# Patient Record
Sex: Female | Born: 1970
Health system: Southern US, Community
[De-identification: ages and names within clinical notes are randomized; demographics above are authoritative.]

## PROBLEM LIST (undated history)

## (undated) DIAGNOSIS — E0500 Thyrotoxicosis with diffuse goiter without thyrotoxic crisis or storm: Secondary | ICD-10-CM

## (undated) DIAGNOSIS — I3139 Other pericardial effusion (noninflammatory): Secondary | ICD-10-CM

## (undated) DIAGNOSIS — I313 Pericardial effusion (noninflammatory): Secondary | ICD-10-CM

## (undated) DIAGNOSIS — E079 Disorder of thyroid, unspecified: Secondary | ICD-10-CM

## (undated) DIAGNOSIS — Z720 Tobacco use: Secondary | ICD-10-CM

## (undated) DIAGNOSIS — J45909 Unspecified asthma, uncomplicated: Secondary | ICD-10-CM

## (undated) HISTORY — DX: Unspecified asthma, uncomplicated: J45.909

## (undated) HISTORY — DX: Tobacco use: Z72.0

## (undated) HISTORY — PX: TONSILLECTOMY: SUR1361

## (undated) HISTORY — DX: Pericardial effusion (noninflammatory): I31.3

## (undated) HISTORY — DX: Other pericardial effusion (noninflammatory): I31.39

---

## 1997-01-02 HISTORY — PX: BREAST LUMPECTOMY: SHX2

## 2003-11-09 ENCOUNTER — Ambulatory Visit: Payer: Self-pay | Admitting: Family Medicine

## 2003-11-09 ENCOUNTER — Other Ambulatory Visit: Admission: RE | Admit: 2003-11-09 | Discharge: 2003-11-09 | Payer: Self-pay | Admitting: Family Medicine

## 2004-01-08 ENCOUNTER — Ambulatory Visit: Payer: Self-pay | Admitting: Family Medicine

## 2005-11-13 ENCOUNTER — Ambulatory Visit: Payer: Self-pay | Admitting: Family Medicine

## 2005-11-20 ENCOUNTER — Encounter: Admission: RE | Admit: 2005-11-20 | Discharge: 2005-11-20 | Payer: Self-pay | Admitting: Family Medicine

## 2006-11-08 DIAGNOSIS — J45909 Unspecified asthma, uncomplicated: Secondary | ICD-10-CM

## 2006-11-14 ENCOUNTER — Ambulatory Visit: Payer: Self-pay | Admitting: Family Medicine

## 2007-08-06 ENCOUNTER — Ambulatory Visit: Payer: Self-pay | Admitting: Family Medicine

## 2007-08-06 DIAGNOSIS — F17200 Nicotine dependence, unspecified, uncomplicated: Secondary | ICD-10-CM

## 2007-09-12 ENCOUNTER — Ambulatory Visit: Payer: Self-pay | Admitting: Family Medicine

## 2007-09-12 LAB — CONVERTED CEMR LAB
ALT: 15 units/L (ref 0–35)
AST: 18 units/L (ref 0–37)
Alkaline Phosphatase: 42 units/L (ref 39–117)
BUN: 7 mg/dL (ref 6–23)
Basophils Absolute: 0 10*3/uL (ref 0.0–0.1)
Basophils Relative: 0.8 % (ref 0.0–3.0)
Blood in Urine, dipstick: NEGATIVE
CO2: 30 meq/L (ref 19–32)
Chloride: 106 meq/L (ref 96–112)
Creatinine, Ser: 0.7 mg/dL (ref 0.4–1.2)
Eosinophils Absolute: 0.2 10*3/uL (ref 0.0–0.7)
Eosinophils Relative: 3.5 % (ref 0.0–5.0)
GFR calc non Af Amer: 99 mL/min
Glucose, Urine, Semiquant: NEGATIVE
HDL: 41.6 mg/dL (ref >39.0)
LDL Cholesterol: 136 mg/dL — ABNORMAL HIGH (ref 0–99)
Lymphocytes Relative: 36.4 % (ref 12.0–46.0)
MCV: 91.7 fL (ref 78.0–100.0)
Neutrophils Relative %: 54.5 % (ref 43.0–77.0)
Nitrite: NEGATIVE
Platelets: 367 10*3/uL (ref 150–400)
Potassium: 4.4 meq/L (ref 3.5–5.1)
Protein, U semiquant: NEGATIVE
RBC: 4.41 M/uL (ref 3.87–5.11)
Total Bilirubin: 0.7 mg/dL (ref 0.3–1.2)
Urobilinogen, UA: 0.2
VLDL: 14 mg/dL (ref 0–40)
WBC Urine, dipstick: NEGATIVE
WBC: 6.2 10*3/uL (ref 4.5–10.5)

## 2007-09-19 ENCOUNTER — Ambulatory Visit: Payer: Self-pay | Admitting: Family Medicine

## 2009-09-08 ENCOUNTER — Ambulatory Visit: Payer: Self-pay | Admitting: Family Medicine

## 2009-09-09 ENCOUNTER — Ambulatory Visit: Payer: Self-pay | Admitting: Family Medicine

## 2010-02-01 NOTE — Assessment & Plan Note (Signed)
Summary: cpx - rv   Vital Signs:  Patient profile:   40 year old female Height:      62 inches Weight:      134 pounds BMI:     24.60 Temp:     98.1 degrees F oral BP sitting:   118 / 78  (left arm) Cuff size:   regular  Vitals Entered By: Kern Reap CMA Duncan Dull) (September 09, 2009 4:54 PM) CC: cpx Is Patient Diabetic? No   CC:  cpx.  History of Present Illness: Kristen Stuart is a 40 year old, divorced female, G1, P1, who comes in today for general physical examination and to request a smoking cessation program.  She's always been in excellent, health.  She's had no chronic health problems.  She currently exercises strenuously 3 times a week and does yoga.  The other two days.  She would like to quit smoking.  She gets routine eye care, dental care, does breast exams, almost daily because she has fibrocystic changes, and had a biopsy of her left breast when she was 20.  She gets annual mammography.  She's currently not sexually active.  LMP now.  She defers back to GYN nurse.  Tetanus 06/12/07 seasonal flu shot June 11, 2009  Her mother died recently and mother had a history of drug addiction and diabetes1.  Her father also died in his 86s from pancreatic cancer  Allergies (verified): 1)  ! Asa 2)  ! Keflex 3)  ! Prednisone  Past History:  Past medical, surgical, family and social histories (including risk factors) reviewed, and no changes noted (except as noted below).  Past Medical History: Reviewed history from 11/14/2006 and no changes required. childbirth x 2 t/a asthma tobacco abuse  Past Surgical History: Lumpectomy..L 12 oclock   Family History: Reviewed history from 09/19/2007 and no changes required. father died of pancreatic cancer at 36, and mother has diabetes, hypertension, high cholesterol , and depression one brother in good healthno sisters  Social History: Reviewed history from 11/14/2006 and no changes required. Current Smoker Drug use-no Regular  exercise-yes Divorced Alcohol use-no  Review of Systems      See HPI  Physical Exam  General:  Well-developed,well-nourished,in no acute distress; alert,appropriate and cooperative throughout examination Head:  Normocephalic and atraumatic without obvious abnormalities. No apparent alopecia or balding. Eyes:  No corneal or conjunctival inflammation noted. EOMI. Perrla. Funduscopic exam benign, without hemorrhages, exudates or papilledema. Vision grossly normal. Ears:  External ear exam shows no significant lesions or deformities.  Otoscopic examination reveals clear canals, tympanic membranes are intact bilaterally without bulging, retraction, inflammation or discharge. Hearing is grossly normal bilaterally. Nose:  External nasal examination shows no deformity or inflammation. Nasal mucosa are pink and moist without lesions or exudates. Mouth:  Oral mucosa and oropharynx without lesions or exudates.  Teeth in good repair. Neck:  No deformities, masses, or tenderness noted. Chest Wall:  No deformities, masses, or tenderness noted. Breasts:  scar in the left breast at the 12 o'clock position from previous biopsy.  Diffuse fibrocystic changes throughout both breasts.  The most dominant lesion is a egg sized  lesion in her left breast at 6 o'clock.  She states this is been present for many years and is not changed. Lungs:  Normal respiratory effort, chest expands symmetrically. Lungs are clear to auscultation, no crackles or wheezes. Heart:  Normal rate and regular rhythm. S1 and S2 normal without gallop, murmur, click, rub or other extra sounds. Abdomen:  Bowel sounds positive,abdomen soft  and non-tender without masses, organomegaly or hernias noted. Msk:  No deformity or scoliosis noted of thoracic or lumbar spine.   Pulses:  R and L carotid,radial,femoral,dorsalis pedis and posterior tibial pulses are full and equal bilaterally Extremities:  No clubbing, cyanosis, edema, or deformity noted with  normal full range of motion of all joints.   Neurologic:  No cranial nerve deficits noted. Station and gait are normal. Plantar reflexes are down-going bilaterally. DTRs are symmetrical throughout. Sensory, motor and coordinative functions appear intact. Skin:  Intact without suspicious lesions or rashes Cervical Nodes:  No lymphadenopathy noted Axillary Nodes:  No palpable lymphadenopathy Inguinal Nodes:  No significant adenopathy Psych:  Cognition and judgment appear intact. Alert and cooperative with normal attention span and concentration. No apparent delusions, illusions, hallucinations   Impression & Recommendations:  Problem # 1:  TOBACCO ABUSE (ICD-305.1) Assessment Unchanged  Her updated medication list for this problem includes:    Chantix Starting Month Pak 0.5 Mg X 11 & 1 Mg X 42 Misc (Varenicline tartrate) ..... Uad    Chantix Continuing Month Pak 1 Mg Tabs (Varenicline tartrate) ..... Uad  Problem # 2:  Preventive Health Care (ICD-V70.0) Assessment: Unchanged  Complete Medication List: 1)  Chantix Starting Month Pak 0.5 Mg X 11 & 1 Mg X 42 Misc (Varenicline tartrate) .... Uad 2)  Chantix Continuing Month Pak 1 Mg Tabs (Varenicline tartrate) .... Uad  Patient Instructions: 1)  began chantix 1 mg pills one half tablet daily, x 3 days, then one half tablet twice daily.  Return in 4 weeks for follow-up.  Also begin to taper smoking by decreasing by two cigarettes every week 2)  Schedule your mammogram. Prescriptions: CHANTIX CONTINUING MONTH PAK 1 MG  TABS (VARENICLINE TARTRATE) UAD  #1 x 2   Entered and Authorized by:   Roderick Pee MD   Signed by:   Roderick Pee MD on 09/09/2009   Method used:   Print then Give to Patient   RxID:   [id number]

## 2010-02-01 NOTE — Assessment & Plan Note (Signed)
Summary: FLU SHOT//ALP  Nurse Visit   Review of Systems       Flu Vaccine Consent Questions     Do you have a history of severe allergic reactions to this vaccine? no    Any prior history of allergic reactions to egg and/or gelatin? no    Do you have a sensitivity to the preservative Thimersol? no    Do you have a past history of Guillan-Barre Syndrome? no    Do you currently have an acute febrile illness? no    Have you ever had a severe reaction to latex? no    Vaccine information given and explained to patient? yes    Are you currently pregnant? no    Lot Number:[id number]   Exp Date:07/02/2010   Site Given  Left Deltoid IM    Allergies: No Known Drug Allergies  Orders Added: 1)  Admin 1st Vaccine [90471] 2)  Flu Vaccine 67yrs + [58099]

## 2010-03-05 ENCOUNTER — Ambulatory Visit (INDEPENDENT_AMBULATORY_CARE_PROVIDER_SITE_OTHER): Payer: No Typology Code available for payment source | Admitting: Family Medicine

## 2010-03-05 ENCOUNTER — Encounter: Payer: Self-pay | Admitting: Family Medicine

## 2010-03-05 DIAGNOSIS — J029 Acute pharyngitis, unspecified: Secondary | ICD-10-CM

## 2010-03-10 NOTE — Assessment & Plan Note (Signed)
Summary: Sore throat, swollen glands   Vital Signs:  Patient profile:   40 year old female Height:      62 inches Weight:      143 pounds BMI:     26.25 O2 Sat:      96 % on Room air Temp:     98.6 degrees F oral Pulse rate:   84 / minute BP sitting:   116 / 82  (left arm) Cuff size:   large  Vitals Entered By: Payton Spark CMA (March 05, 2010 12:03 PM)  O2 Flow:  Room air CC: ST x 1 day   Primary Care Provider:  Roderick Pee MD  CC:  ST x 1 day.  History of Present Illness: ST started last night. Painful to eat and swallow.  thought initially she had talked too much.  Felt very dry.  Only on the left side.  no known exposure to strep throat.  No fever or chills. some sweats last night.  Slight cough. No runny nose.  no GI sxs.  No HA.  No meds OTC for sxs.   Current Medications (verified): 1)  None  Allergies (verified): 1)  ! Asa 2)  ! Keflex 3)  ! Prednisone  Past History:  Past Surgical History: Lumpectomy..L 12 oclock  tonsillectomy   Physical Exam  General:  Well-developed,well-nourished,in no acute distress; alert,appropriate and cooperative throughout examination Head:  Normocephalic and atraumatic without obvious abnormalities. No apparent alopecia or balding. Eyes:  No corneal or conjunctival inflammation noted. EOMI. Perrla.  Ears:  External ear exam shows no significant lesions or deformities.  Otoscopic examination reveals clear canals, tympanic membranes are intact bilaterally without bulging, retraction, inflammation or discharge. Hearing is grossly normal bilaterally. Nose:  External nasal examination shows no deformity or inflammation.  Mouth:  Oral mucosa and oropharynx without lesions or exudates.  Teeth in good repair. Neck:  No deformities, masses, or tenderness noted. Lungs:  Normal respiratory effort, chest expands symmetrically. Lungs are clear to auscultation, no crackles or wheezes. Heart:  Normal rate and regular rhythm. S1 and S2  normal without gallop, murmur, click, rub or other extra sounds. Skin:  no rashes.   Cervical Nodes:  No lymphadenopathy noted Psych:  Cognition and judgment appear intact. Alert and cooperative with normal attention span and concentration. No apparent delusions, illusions, hallucinations   Impression & Recommendations:  Problem # 1:  PHARYNGITIS-ACUTE (ICD-462) Likely viral.  Rapid strep was neg.  Symptomatic care. Zince lozenges.  Call if not better in 1 wk.   Other Orders: Rapid Strep (40981)  Patient Instructions: 1)  Cold eeze lozenges  every 4 hours 2)  Dalt water gargles.  3)  Call if you better in a week.    Orders Added: 1)  Rapid Strep [19147] 2)  Est. Patient Level III [82956]    Laboratory Results    Other Tests  Rapid Strep: negative

## 2012-08-12 ENCOUNTER — Other Ambulatory Visit (INDEPENDENT_AMBULATORY_CARE_PROVIDER_SITE_OTHER): Payer: PRIVATE HEALTH INSURANCE

## 2012-08-12 DIAGNOSIS — Z0000 Encounter for general adult medical examination without abnormal findings: Secondary | ICD-10-CM

## 2012-08-12 LAB — LIPID PANEL
LDL Cholesterol: 98 mg/dL (ref 0–99)
Total CHOL/HDL Ratio: 5
Triglycerides: 187 mg/dL — ABNORMAL HIGH (ref 0.0–149.0)
VLDL: 37.4 mg/dL (ref 0.0–40.0)

## 2012-08-12 LAB — HEPATIC FUNCTION PANEL
ALT: 21 U/L (ref 0–35)
Albumin: 3.9 g/dL (ref 3.5–5.2)
Total Bilirubin: 0.6 mg/dL (ref 0.3–1.2)
Total Protein: 6.8 g/dL (ref 6.0–8.3)

## 2012-08-12 LAB — CBC WITH DIFFERENTIAL/PLATELET
Basophils Absolute: 0 10*3/uL (ref 0.0–0.1)
Eosinophils Absolute: 0.1 10*3/uL (ref 0.0–0.7)
HCT: 42.7 % (ref 36.0–46.0)
Lymphs Abs: 2 10*3/uL (ref 0.7–4.0)
MCHC: 33 g/dL (ref 30.0–36.0)
MCV: 91.7 fl (ref 78.0–100.0)
Monocytes Absolute: 0.5 10*3/uL (ref 0.1–1.0)
Neutrophils Relative %: 52.7 % (ref 43.0–77.0)
Platelets: 294 10*3/uL (ref 150.0–400.0)
RDW: 13.3 % (ref 11.5–14.6)

## 2012-08-12 LAB — BASIC METABOLIC PANEL
BUN: 11 mg/dL (ref 6–23)
Chloride: 109 mEq/L (ref 96–112)
Creatinine, Ser: 0.7 mg/dL (ref 0.4–1.2)
Glucose, Bld: 107 mg/dL — ABNORMAL HIGH (ref 70–99)
Potassium: 5.2 mEq/L — ABNORMAL HIGH (ref 3.5–5.1)

## 2012-08-12 LAB — POCT URINALYSIS DIPSTICK
Ketones, UA: NEGATIVE
Protein, UA: NEGATIVE
Spec Grav, UA: 1.015
Urobilinogen, UA: 0.2

## 2012-08-19 ENCOUNTER — Ambulatory Visit (INDEPENDENT_AMBULATORY_CARE_PROVIDER_SITE_OTHER): Payer: PRIVATE HEALTH INSURANCE | Admitting: Family

## 2012-08-19 ENCOUNTER — Other Ambulatory Visit: Payer: Self-pay | Admitting: Family

## 2012-08-19 ENCOUNTER — Other Ambulatory Visit (HOSPITAL_COMMUNITY)
Admission: RE | Admit: 2012-08-19 | Discharge: 2012-08-19 | Disposition: A | Discharge status: Home / Self Care | Payer: No Typology Code available for payment source | Source: Ambulatory Visit | Attending: Family | Admitting: Family

## 2012-08-19 ENCOUNTER — Encounter: Payer: Self-pay | Admitting: Family

## 2012-08-19 VITALS — BP 110/66 | HR 94 | Ht 62.5 in | Wt 146.0 lb

## 2012-08-19 DIAGNOSIS — Z01419 Encounter for gynecological examination (general) (routine) without abnormal findings: Secondary | ICD-10-CM | POA: Insufficient documentation

## 2012-08-19 DIAGNOSIS — R739 Hyperglycemia, unspecified: Secondary | ICD-10-CM

## 2012-08-19 DIAGNOSIS — E0590 Thyrotoxicosis, unspecified without thyrotoxic crisis or storm: Secondary | ICD-10-CM

## 2012-08-19 DIAGNOSIS — Z1231 Encounter for screening mammogram for malignant neoplasm of breast: Secondary | ICD-10-CM

## 2012-08-19 DIAGNOSIS — Z0000 Encounter for general adult medical examination without abnormal findings: Secondary | ICD-10-CM

## 2012-08-19 DIAGNOSIS — R7309 Other abnormal glucose: Secondary | ICD-10-CM

## 2012-08-19 DIAGNOSIS — Z124 Encounter for screening for malignant neoplasm of cervix: Secondary | ICD-10-CM

## 2012-08-19 LAB — HEMOGLOBIN A1C: Hgb A1c MFr Bld: 5.5 % (ref 4.6–6.5)

## 2012-08-19 NOTE — Progress Notes (Signed)
Subjective:    Patient ID: Kristen Stuart, female    DOB: 10/27/1970, 42 y.o.   MRN: [id number]  HPI  This is a routine physical examination for this healthy  Female. Reviewed all health maintenance protocols including mammography, and reviewed appropriate screening labs. Her immunization history was reviewed as well as her current medications and allergie the plan for yearly health maintenance was discussed all orders and referrals were made as appropriate.  Review of Systems  Constitutional: Negative.   HENT: Negative.   Eyes: Negative.   Respiratory: Negative.   Cardiovascular: Negative.   Gastrointestinal: Negative.   Endocrine: Negative.   Genitourinary: Negative.   Musculoskeletal: Negative.   Skin: Negative.   Allergic/Immunologic: Negative.   Neurological: Negative.   Hematological: Negative.   Psychiatric/Behavioral: Negative.    Past Medical History  Diagnosis Date  . Asthma     History   Social History  . Marital Status: Married    Spouse Name: N/A    Number of Children: N/A  . Years of Education: N/A   Occupational History  . Not on file.   Social History Main Topics  . Smoking status: Current Every Day Smoker  . Smokeless tobacco: Not on file  . Alcohol Use: Yes  . Drug Use: No  . Sexual Activity: Not on file   Other Topics Concern  . Not on file   Social History Narrative  . No narrative on file    Past Surgical History  Procedure Laterality Date  . Tonsillectomy    . Breast lumpectomy      L 12 o'clock    Family History  Problem Relation Age of Onset  . Diabetes Mother   . Hypertension Mother   . Hyperlipidemia Mother   . Depression Mother   . Cancer Father     Pancreatic     Allergies  Allergen Reactions  . Aspirin     REACTION: gi upset  . Cephalexin   . Prednisone     No current outpatient prescriptions on file prior to visit.   No current facility-administered medications on file prior to visit.    BP 110/66   Pulse 94  Ht 5' 2.5" (1.588 m)  Wt 146 lb (66.225 kg)  BMI 26.26 kg/m2  SpO2 98%  LMP 08/04/2014chart    Objective:   Physical Exam  Constitutional: She is oriented to person, place, and time. She appears well-developed and well-nourished.  HENT:  Head: Normocephalic.  Right Ear: External ear normal.  Left Ear: External ear normal.  Nose: Nose normal.  Mouth/Throat: Oropharynx is clear and moist.  Eyes: Conjunctivae and EOM are normal. Pupils are equal, round, and reactive to light.  Neck: Normal range of motion. Neck supple. No thyromegaly present.  Cardiovascular: Normal rate, regular rhythm and normal heart sounds.   Pulmonary/Chest: Effort normal and breath sounds normal.  Abdominal: Soft. Bowel sounds are normal.  Genitourinary: Vagina normal and uterus normal. Guaiac negative stool. No vaginal discharge found.  Musculoskeletal: Normal range of motion. She exhibits no edema and no tenderness.  Neurological: She is alert and oriented to person, place, and time. She has normal reflexes. She displays normal reflexes. No cranial nerve deficit. Coordination normal.  Skin: Skin is warm and dry.  Psychiatric: She has a normal mood and affect.          Assessment & Plan:  Assessment:  1. CPX 2. Tobacco Abuse 3. Low TSH 4.Hyperglycemia   Plan: Sent T3 and T4 and A1c  will notify patient pending results. Exercise daily. Refer to endocrinology if T3 and T4 abnormal. Call the office with any questions or concerns. Recheck in 6 weeks and sooner as needed.

## 2012-08-19 NOTE — Patient Instructions (Addendum)

## 2012-08-29 ENCOUNTER — Ambulatory Visit
Admission: RE | Admit: 2012-08-29 | Discharge: 2012-08-29 | Disposition: A | Discharge status: Home / Self Care | Payer: PRIVATE HEALTH INSURANCE | Source: Ambulatory Visit | Attending: Family | Admitting: Family

## 2012-08-29 DIAGNOSIS — Z1231 Encounter for screening mammogram for malignant neoplasm of breast: Secondary | ICD-10-CM

## 2012-09-03 ENCOUNTER — Ambulatory Visit (INDEPENDENT_AMBULATORY_CARE_PROVIDER_SITE_OTHER): Payer: No Typology Code available for payment source | Admitting: Endocrinology

## 2012-09-03 ENCOUNTER — Encounter: Payer: Self-pay | Admitting: Endocrinology

## 2012-09-03 VITALS — BP 122/70 | HR 110 | Ht 63.0 in | Wt 150.0 lb

## 2012-09-03 DIAGNOSIS — E0590 Thyrotoxicosis, unspecified without thyrotoxic crisis or storm: Secondary | ICD-10-CM

## 2012-09-03 LAB — T3, FREE: T3, Free: 6.6 pg/mL — ABNORMAL HIGH (ref 2.3–4.2)

## 2012-09-03 MED ORDER — METOPROLOL SUCCINATE ER 25 MG PO TB24: 25.0000 mg | ORAL_TABLET | Freq: Every day | ORAL | Status: DC

## 2012-09-03 MED ORDER — METHIMAZOLE 10 MG PO TABS: ORAL_TABLET | ORAL | Status: DC

## 2012-09-03 NOTE — Progress Notes (Signed)
Subjective:    Patient ID: Kristen Stuart, female    DOB: 1970-12-30, 42 y.o.   MRN: [id number]  HPI Pt was noted 1 month ago to have abnormal TFT.  She has moderate palpitations in the chest, and assoc weight gain.   Past Medical History  Diagnosis Date  . Asthma     Past Surgical History  Procedure Laterality Date  . Tonsillectomy    . Breast lumpectomy      L 12 o'clock    History   Social History  . Marital Status: Married    Spouse Name: N/A    Number of Children: N/A  . Years of Education: N/A   Occupational History  . Not on file.   Social History Main Topics  . Smoking status: Current Every Day Smoker  . Smokeless tobacco: Not on file  . Alcohol Use: Yes  . Drug Use: No  . Sexual Activity: Not on file   Other Topics Concern  . Not on file   Social History Narrative  . No narrative on file    No current outpatient prescriptions on file prior to visit.   No current facility-administered medications on file prior to visit.    Allergies  Allergen Reactions  . Aspirin     REACTION: gi upset  . Cephalexin   . Prednisone     Family History  Problem Relation Age of Onset  . Diabetes Mother   . Hypertension Mother   . Hyperlipidemia Mother   . Depression Mother   . Cancer Father     Pancreatic   no thyroid probs in immediate family.  BP 122/70  Pulse 110  Ht 5\' 3"  (1.6 m)  Wt 150 lb (68.04 kg)  BMI 26.58 kg/m2  SpO2 98%  LMP 08/05/2012  Review of Systems denies fever, hoarseness, double vision, sob, diarrhea, polyuria, myalgias, and numbness.  She reports edema, tremor, excessive diaphoresis, anxiety, muscle cramps, heat intolerance, rhinorrhea, easy bruising, headache, and doe.  She has regular menses.    Objective:   Physical Exam VS: see vs page GEN: no distress HEAD: head: no deformity eyes: no periorbital swelling, no proptosis external nose and ears are normal mouth: no lesion seen NECK: thyroid is slightly enlarged,  (L>R), with ? Of left-sided nodule.  CHEST WALL: no deformity LUNGS:  Clear to auscultation.   CV: tachycardic rate, but reg rhythm, no murmur. ABD: abdomen is soft, nontender.  no hepatosplenomegaly.  not distended.  no hernia MUSCULOSKELETAL: muscle bulk and strength are grossly normal.  no obvious joint swelling.  gait is normal and steady EXTEMITIES: no deformity.  no ulcer on the feet.  feet are of normal color and temp.  Trace edema of the left leg, but none on the right.   PULSES: dorsalis pedis intact bilat.   NEURO:  cn 2-12 grossly intact.   readily moves all 4's.  sensation is intact to touch on the feet.  Slight coarse tremor of the hands. SKIN:  Normal texture and temperature.  No rash or suspicious lesion is visible.  Slightly diaphoretic.  NODES:  None palpable at the neck PSYCH: alert, oriented x3.  Does not appear anxious nor depressed.   Lab Results  Component Value Date   TSH 0.04* 09/03/2012      Assessment & Plan:  Hyperthyroidism, new.  Several possible etiologies. Tachycardia.  In this setting, she should be rx'ed first with thionamide rx.  We can consider i-131 rx later. Weight gain, sometimes seen  in hyperthyroidism

## 2012-09-03 NOTE — Patient Instructions (Addendum)
blood tests are being requested for you today.  We'll contact you with results. If your thyroid is still overactive, i'll prescribe for you a pill for it, called "methimazole." Please come back for a follow-up appointment in 3 weeks. if ever you have fever while taking methimazole, stop it and call us, because of the risk of a rare side-effect.   i have sent a prescription to your pharmacy, also for a pill to slow down the heart rate.

## 2012-09-24 ENCOUNTER — Encounter: Payer: Self-pay | Admitting: Endocrinology

## 2012-09-24 ENCOUNTER — Ambulatory Visit (INDEPENDENT_AMBULATORY_CARE_PROVIDER_SITE_OTHER): Payer: PRIVATE HEALTH INSURANCE | Admitting: Endocrinology

## 2012-09-24 VITALS — BP 122/80 | HR 104 | Wt 143.0 lb

## 2012-09-24 DIAGNOSIS — E0590 Thyrotoxicosis, unspecified without thyrotoxic crisis or storm: Secondary | ICD-10-CM

## 2012-09-24 DIAGNOSIS — Z23 Encounter for immunization: Secondary | ICD-10-CM

## 2012-09-24 NOTE — Progress Notes (Signed)
  Subjective:    Patient ID: Kristen Stuart, female    DOB: Mar 03, 1970, 42 y.o.   MRN: [id number]  HPI Pt was noted in august of 2014 to have abnormal TFT.  Due to tachycardia, tapazole was selected as initial rx.  Since on the toprol, she feels better in general.  However, she just got the tapazole filled a few days ago.   Past Medical History  Diagnosis Date  . Asthma     Past Surgical History  Procedure Laterality Date  . Tonsillectomy    . Breast lumpectomy      L 12 o'clock    History   Social History  . Marital Status: Married    Spouse Name: N/A    Number of Children: N/A  . Years of Education: N/A   Occupational History  . Not on file.   Social History Main Topics  . Smoking status: Current Every Day Smoker  . Smokeless tobacco: Not on file  . Alcohol Use: Yes  . Drug Use: No  . Sexual Activity: Not on file   Other Topics Concern  . Not on file   Social History Narrative  . No narrative on file    Current Outpatient Prescriptions on File Prior to Visit  Medication Sig Dispense Refill  . methimazole (TAPAZOLE) 10 MG tablet 2 tabs, twice a day  120 tablet  1  . metoprolol succinate (TOPROL-XL) 25 MG 24 hr tablet Take 1 tablet (25 mg total) by mouth daily.  90 tablet  3   No current facility-administered medications on file prior to visit.    Allergies  Allergen Reactions  . Aspirin     REACTION: gi upset  . Cephalexin   . Prednisone    Family History  Problem Relation Age of Onset  . Diabetes Mother   . Hypertension Mother   . Hyperlipidemia Mother   . Depression Mother   . Cancer Father     Pancreatic    BP 122/80  Pulse 104  Wt 143 lb (64.864 kg)  BMI 25.34 kg/m2  SpO2 97%  Review of Systems Denies fever.  Tremor is improved, but persists.     Objective:   Physical Exam VITAL SIGNS:  See vs page GENERAL: no distress NECK: thyroid is slightly enlarged, (L>R), with ? Of left-sided nodule.  Neuro: no tremor     Assessment &  Plan:  Hyperthyroidism, clinically better.

## 2012-09-24 NOTE — Patient Instructions (Addendum)
Please come back for a follow-up appointment in 3 weeks. if ever you have fever while taking methimazole, stop it and call us, because of the risk of a rare side-effect.

## 2012-10-14 ENCOUNTER — Ambulatory Visit (INDEPENDENT_AMBULATORY_CARE_PROVIDER_SITE_OTHER): Payer: PRIVATE HEALTH INSURANCE | Admitting: Endocrinology

## 2012-10-14 ENCOUNTER — Encounter: Payer: Self-pay | Admitting: Endocrinology

## 2012-10-14 ENCOUNTER — Other Ambulatory Visit: Payer: Self-pay

## 2012-10-14 VITALS — BP 126/80 | HR 90 | Wt 142.0 lb

## 2012-10-14 DIAGNOSIS — E0590 Thyrotoxicosis, unspecified without thyrotoxic crisis or storm: Secondary | ICD-10-CM

## 2012-10-14 LAB — T4, FREE: Free T4: 0.61 ng/dL (ref 0.60–1.60)

## 2012-10-14 MED ORDER — BUPROPION HCL ER (XL) 150 MG PO TB24: 150.0000 mg | ORAL_TABLET | Freq: Every day | ORAL | Status: DC

## 2012-10-14 NOTE — Progress Notes (Signed)
  Subjective:    Patient ID: Kristen Stuart, female    DOB: [birth year], 42 y.o.   MRN: [id number]  HPI Pt was noted in august of 2014 to have abnormal TFT.  Due to tachycardia, tapazole was selected as initial rx.  Since last ov, she feels much better in general.   She wants to quit smoking.  She says her insurance does not cover chantix.   Past Medical History  Diagnosis Date  . Asthma     Past Surgical History  Procedure Laterality Date  . Tonsillectomy    . Breast lumpectomy      L 12 o'clock    History   Social History  . Marital Status: Married    Spouse Name: N/A    Number of Children: N/A  . Years of Education: N/A   Occupational History  . Not on file.   Social History Main Topics  . Smoking status: Current Every Day Smoker  . Smokeless tobacco: Not on file  . Alcohol Use: Yes  . Drug Use: No  . Sexual Activity: Not on file   Other Topics Concern  . Not on file   Social History Narrative  . No narrative on file    Current Outpatient Prescriptions on File Prior to Visit  Medication Sig Dispense Refill  . methimazole (TAPAZOLE) 10 MG tablet 2 tabs, twice a day  120 tablet  1  . metoprolol succinate (TOPROL-XL) 25 MG 24 hr tablet Take 1 tablet (25 mg total) by mouth daily.  90 tablet  3   No current facility-administered medications on file prior to visit.    Allergies  Allergen Reactions  . Aspirin     REACTION: gi upset  . Cephalexin   . Prednisone     Family History  Problem Relation Age of Onset  . Diabetes Mother   . Hypertension Mother   . Hyperlipidemia Mother   . Depression Mother   . Cancer Father     Pancreatic     BP 126/80  Pulse 90  Wt 142 lb (64.411 kg)  BMI 25.16 kg/m2  SpO2 98%   Review of Systems Denies fever and palpitations.     Objective:   Physical Exam VITAL SIGNS:  See vs page GENERAL: no distress NECK: thyroid is slightly enlarged, (L>R).  Neuro: no tremor.       Assessment & Plan:   Hyperthyroidism, clinically improved.   Tachycardia: for now, she'll need to continue the toprol.  Smoker: she wants to quit.

## 2012-10-14 NOTE — Patient Instructions (Addendum)
Please come back for a follow-up appointment in 4-6 weeks. if ever you have fever while taking methimazole, stop it and call us, because of the risk of a rare side-effect.   blood tests are being requested for you today.  We'll contact you with results. i have sent a prescription to your pharmacy, for a quit smoking medication.  We can double this if necessary--please call if you want to increase the dosage.

## 2012-11-18 ENCOUNTER — Ambulatory Visit (INDEPENDENT_AMBULATORY_CARE_PROVIDER_SITE_OTHER): Payer: No Typology Code available for payment source | Admitting: Endocrinology

## 2012-11-18 VITALS — BP 120/80 | HR 88 | Temp 97.9°F | Resp 16 | Ht 63.0 in | Wt 142.3 lb

## 2012-11-18 DIAGNOSIS — E0590 Thyrotoxicosis, unspecified without thyrotoxic crisis or storm: Secondary | ICD-10-CM

## 2012-11-18 NOTE — Patient Instructions (Addendum)
Please stop the methimazole pill.   let's check a thyroid "scan" (a special, but easy and painless type of thyroid x ray).  It works like this: you go to the x-ray department of the hospital to swallow a pill, which contains a miniscule amount of radiation.  You will not notice any symptoms from this.  You will go back to the x-ray department the next day, to lie down in front of a camera.  The results of this will be sent to me.   Based on the results, i hope to order for you a treatment pill of radioactive iodine.  Although it is a larger amount of radiation, you will again notice no symptoms from this.  The pill is gone from your body in a few days (during which you should stay away from other people), but takes several months to work.  Therefore, please return here approximately 6-8 weeks after the treatment.  This treatment has been available for many years, and the only known side-effect is an underactive thyroid.  It is possible that i would eventually prescribe for you a thyroid hormone pill, which is very inexpensive.  You don't have to worry about side-effects of this thyroid hormone pill, because it is the same molecule your thyroid makes.

## 2012-11-18 NOTE — Progress Notes (Signed)
  Subjective:    Patient ID: Kristen Stuart, female    DOB: 10-16-1970, 42 y.o.   MRN: [id number]  HPI Pt returns for f/u of hyperthyroidism (dx'ed august of 2014, on a routine blood test).  Due to tachycardia, tapazole was selected as initial rx.  However, she says she now wants to have i-131 rx.  pt states she feels well in general.   Past Medical History  Diagnosis Date  . Asthma     Past Surgical History  Procedure Laterality Date  . Tonsillectomy    . Breast lumpectomy      L 12 o'clock    History   Social History  . Marital Status: Married    Spouse Name: N/A    Number of Children: N/A  . Years of Education: N/A   Occupational History  . Not on file.   Social History Main Topics  . Smoking status: Current Every Day Smoker  . Smokeless tobacco: Not on file  . Alcohol Use: Yes  . Drug Use: No  . Sexual Activity: Not on file   Other Topics Concern  . Not on file   Social History Narrative  . No narrative on file    Current Outpatient Prescriptions on File Prior to Visit  Medication Sig Dispense Refill  . buPROPion (WELLBUTRIN XL) 150 MG 24 hr tablet Take 1 tablet (150 mg total) by mouth daily.  30 tablet  11  . metoprolol succinate (TOPROL-XL) 25 MG 24 hr tablet Take 1 tablet (25 mg total) by mouth daily.  90 tablet  3   No current facility-administered medications on file prior to visit.    Allergies  Allergen Reactions  . Aspirin     REACTION: gi upset  . Cephalexin   . Prednisone     Family History  Problem Relation Age of Onset  . Diabetes Mother   . Hypertension Mother   . Hyperlipidemia Mother   . Depression Mother   . Cancer Father     Pancreatic     BP 120/80  Pulse 88  Temp(Src) 97.9 F (36.6 C) (Oral)  Resp 16  Ht 5\' 3"  (1.6 m)  Wt 142 lb 4.8 oz (64.547 kg)  BMI 25.21 kg/m2  LMP 11/15/2012  Review of Systems Denies fever    Objective:   Physical Exam VITAL SIGNS:  See vs page GENERAL: no distress Skin: not  diaphoretic Neuro: no tremor.   Lab Results  Component Value Date   TSH 0.16* 10/14/2012      Assessment & Plan:  Hyperthyroidism, uncertain etiology.  She wants i-131 rx now, and i agree.

## 2012-12-09 ENCOUNTER — Encounter (HOSPITAL_COMMUNITY): Admission: RE | Admit: 2012-12-09 | Payer: No Typology Code available for payment source | Source: Ambulatory Visit

## 2012-12-10 ENCOUNTER — Encounter (HOSPITAL_COMMUNITY): Payer: No Typology Code available for payment source

## 2013-01-18 ENCOUNTER — Encounter (HOSPITAL_COMMUNITY): Payer: Self-pay | Admitting: Emergency Medicine

## 2013-01-18 ENCOUNTER — Emergency Department (HOSPITAL_COMMUNITY): Payer: No Typology Code available for payment source

## 2013-01-18 ENCOUNTER — Emergency Department (HOSPITAL_COMMUNITY)
Admission: EM | Admit: 2013-01-18 | Discharge: 2013-01-18 | Disposition: A | Discharge status: Home / Self Care | Payer: No Typology Code available for payment source | Attending: Emergency Medicine | Admitting: Emergency Medicine

## 2013-01-18 DIAGNOSIS — F17200 Nicotine dependence, unspecified, uncomplicated: Secondary | ICD-10-CM | POA: Insufficient documentation

## 2013-01-18 DIAGNOSIS — Z8639 Personal history of other endocrine, nutritional and metabolic disease: Secondary | ICD-10-CM | POA: Insufficient documentation

## 2013-01-18 DIAGNOSIS — Z79899 Other long term (current) drug therapy: Secondary | ICD-10-CM | POA: Insufficient documentation

## 2013-01-18 DIAGNOSIS — M25519 Pain in unspecified shoulder: Secondary | ICD-10-CM | POA: Insufficient documentation

## 2013-01-18 DIAGNOSIS — R079 Chest pain, unspecified: Secondary | ICD-10-CM | POA: Insufficient documentation

## 2013-01-18 DIAGNOSIS — J45909 Unspecified asthma, uncomplicated: Secondary | ICD-10-CM | POA: Insufficient documentation

## 2013-01-18 DIAGNOSIS — Z862 Personal history of diseases of the blood and blood-forming organs and certain disorders involving the immune mechanism: Secondary | ICD-10-CM | POA: Insufficient documentation

## 2013-01-18 DIAGNOSIS — M2550 Pain in unspecified joint: Secondary | ICD-10-CM | POA: Insufficient documentation

## 2013-01-18 DIAGNOSIS — M25512 Pain in left shoulder: Secondary | ICD-10-CM

## 2013-01-18 HISTORY — DX: Disorder of thyroid, unspecified: E07.9

## 2013-01-18 LAB — CBC
HEMATOCRIT: 40 % (ref 36.0–46.0)
Hemoglobin: 13.5 g/dL (ref 12.0–15.0)
MCH: 29.6 pg (ref 26.0–34.0)
MCHC: 33.8 g/dL (ref 30.0–36.0)
MCV: 87.7 fL (ref 78.0–100.0)
Platelets: 267 10*3/uL (ref 150–400)
RBC: 4.56 MIL/uL (ref 3.87–5.11)
RDW: 13.5 % (ref 11.5–15.5)
WBC: 13.2 10*3/uL — ABNORMAL HIGH (ref 4.0–10.5)

## 2013-01-18 LAB — COMPREHENSIVE METABOLIC PANEL
ALBUMIN: 3.6 g/dL (ref 3.5–5.2)
ALK PHOS: 61 U/L (ref 39–117)
ALT: 26 U/L (ref 0–35)
AST: 15 U/L (ref 0–37)
BUN: 16 mg/dL (ref 6–23)
CHLORIDE: 102 meq/L (ref 96–112)
CO2: 25 mEq/L (ref 19–32)
CREATININE: 0.54 mg/dL (ref 0.50–1.10)
Calcium: 9.3 mg/dL (ref 8.4–10.5)
GLUCOSE: 122 mg/dL — AB (ref 70–99)
POTASSIUM: 5 meq/L (ref 3.7–5.3)
Sodium: 139 mEq/L (ref 137–147)
Total Protein: 6.3 g/dL (ref 6.0–8.3)

## 2013-01-18 LAB — POCT I-STAT TROPONIN I
Troponin i, poc: 0.01 ng/mL (ref 0.00–0.08)
Troponin i, poc: 0 ng/mL (ref 0.00–0.08)

## 2013-01-18 NOTE — Discharge Instructions (Signed)
Discuss stress test with your physician, any concerns take aspirin and come to the ER.   If you were given medicines take as directed.  If you are on coumadin or contraceptives realize their levels and effectiveness is altered by many different medicines.  If you have any reaction (rash, tongues swelling, other) to the medicines stop taking and see a physician.   Please follow up as directed and return to the ER or see a physician for new or worsening symptoms (chest pain, shortness of breath).  Thank you.

## 2013-01-18 NOTE — ED Notes (Signed)
PT arrived to the ED with a complaint of chest pain.  Pt has a thyroid issue which causes her heart to be tachycardic.  Pt was awoken during her sleep by pain in her left chest that radiated to her left arm and jaw.

## 2013-01-18 NOTE — ED Provider Notes (Signed)
CSN: [id number]     Arrival date & time 01/18/13  0218 History   First MD Initiated Contact with Patient 01/18/13 [phone]     Chief Complaint  Patient presents with  . Chest Pain   (Consider location/radiation/quality/duration/timing/severity/associated sxs/prior Treatment) HPI Comments: 43 yo female with smoking and hyperthyroid hx presents with left upper chest/ shoulder pain with radiation to left arm, woke her from sleep, sharp, lasted 2 hrs, started at 1115 pm, constant.  Pt has had mild intermittent sxs with no association in the past.  No known neck or shoulder pathology.  No known injury.  No sxs currently.  No sob.  Patient denies blood clot history, active cancer, recent major trauma or surgery, unilateral leg swelling/ pain, recent long travel, hemoptysis or oral contraceptives.  No htn, chol, dm or cardiac hx.   No exertional sxs recently, she exercises regularly.  Patient is a 43 y.o. female presenting with chest pain. The history is provided by the patient.  Chest Pain Associated symptoms: no abdominal pain, no back pain, no cough, no fever, no headache, no shortness of breath and not vomiting     Past Medical History  Diagnosis Date  . Asthma   . Thyroid disease    Past Surgical History  Procedure Laterality Date  . Tonsillectomy    . Breast lumpectomy      L 12 o'clock   Family History  Problem Relation Age of Onset  . Diabetes Mother   . Hypertension Mother   . Hyperlipidemia Mother   . Depression Mother   . Cancer Father     Pancreatic    History  Substance Use Topics  . Smoking status: Current Every Day Smoker  . Smokeless tobacco: Not on file  . Alcohol Use: Yes   OB History   Grav Para Term Preterm Abortions TAB SAB Ect Mult Living                 Review of Systems  Constitutional: Negative for fever and chills.  HENT: Negative for congestion.   Eyes: Negative for visual disturbance.  Respiratory: Negative for cough and shortness of breath.    Cardiovascular: Positive for chest pain. Negative for leg swelling.  Gastrointestinal: Negative for vomiting and abdominal pain.  Genitourinary: Negative for dysuria and flank pain.  Musculoskeletal: Positive for arthralgias. Negative for back pain, neck pain and neck stiffness.  Skin: Negative for rash.  Neurological: Negative for light-headedness and headaches.    Allergies  Fish allergy; Shellfish allergy; Aspirin; Cephalexin; and Prednisone  Home Medications   Current Outpatient Rx  Name  Route  Sig  Dispense  Refill  . aspirin 81 MG chewable tablet   Oral   Chew 162 mg by mouth once as needed (chest pain).         Marland Kitchen. HYDROcodone-acetaminophen (VICODIN ES) 7.5-750 MG per tablet   Oral   Take 1 tablet by mouth every 6 (six) hours as needed for pain.         . methimazole (TAPAZOLE) 10 MG tablet   Oral   Take 10 mg by mouth every morning.         . metoprolol succinate (TOPROL-XL) 25 MG 24 hr tablet   Oral   Take 1 tablet (25 mg total) by mouth daily.   90 tablet   3    BP 122/68  Pulse 94  Temp(Src) 98.1 F (36.7 C) (Oral)  Resp 18  SpO2 97%  LMP 12/28/2012 Physical Exam  Nursing note and vitals reviewed. Constitutional: She is oriented to person, place, and time. She appears well-developed and well-nourished.  HENT:  Head: Normocephalic and atraumatic.  Eyes: Conjunctivae are normal. Right eye exhibits no discharge. Left eye exhibits no discharge.  Neck: Normal range of motion. Neck supple. No tracheal deviation present.  Cardiovascular: Normal rate, regular rhythm and intact distal pulses.   No murmur heard. Pulmonary/Chest: Effort normal and breath sounds normal.  Abdominal: Soft. She exhibits no distension. There is no tenderness. There is no guarding.  Musculoskeletal: She exhibits tenderness (left anterior shoulder tender to palpation, full rom of shoulder, full strenght w f/e and ext rotation, nv intact). She exhibits no edema.  Neurological: She  is alert and oriented to person, place, and time.  Skin: Skin is warm. No rash noted.  Psychiatric: She has a normal mood and affect.    ED Course  Procedures (including critical care time) Labs Review Labs Reviewed  CBC - Abnormal; Notable for the following:    WBC 13.2 (*)    All other components within normal limits  COMPREHENSIVE METABOLIC PANEL - Abnormal; Notable for the following:    Glucose, Bld 122 (*)    Total Bilirubin <0.2 (*)    All other components within normal limits  URINALYSIS, ROUTINE W REFLEX MICROSCOPIC  POCT I-STAT TROPONIN I  POCT I-STAT TROPONIN I   Imaging Review Dg Chest 2 View  01/18/2013   CLINICAL DATA:  Left-sided chest pain, radiating into the left jaw and left arm. History of smoking and asthma.  EXAM: CHEST  2 VIEW  COMPARISON:  None.  FINDINGS: The lungs are well-aerated and clear. There is no evidence of focal opacification, pleural effusion or pneumothorax.  The heart is normal in size; the mediastinal contour is within normal limits. No acute osseous abnormalities are seen.  IMPRESSION: No acute cardiopulmonary process seen.   Electronically Signed   By: Roanna Raider M.D.   On: 01/18/2013 03:17    EKG Interpretation    Date/Time:  Saturday January 18 2013 02:27:11 EST Ventricular Rate:  92 PR Interval:  129 QRS Duration: 80 QT Interval:  342 QTC Calculation: 423 R Axis:   72 Text Interpretation:  Sinus rhythm Atrial premature complex RSR' in V1 or V2, right VCD or RVH Confirmed by Lahna Nath  MD, Timira Bieda ([id number]) on 01/18/2013 5:24:03 AM            MDM   1. Left shoulder pain   2. Chest pain    Well appearing, low risk CAD, atypical sxs, discussed observation in hospital for serial troponins, pt has to be home for her son by 630 and wants to leave if second troponin neg, she understands this still may be her heart and she may need stress test/ further eval.  Patient has capacity to make decisions, understands benefits of hospitalization  and risks of going home may result in worsening health condition including heart attack.  Patient refuses hospital placement. Patient understands they may return at any time.   Low risk PE, perc neg, no ddimer indicated. No cp in ED.   Results and differential diagnosis were discussed with the patient. Close follow up outpatient was discussed, patient comfortable with the plan.   Diagnosis: Chest pain, Left shoulder pain    Enid Skeens, MD 01/18/13 [phone]

## 2013-02-17 ENCOUNTER — Encounter: Payer: Self-pay | Admitting: Family

## 2013-02-17 ENCOUNTER — Other Ambulatory Visit: Payer: Self-pay | Admitting: Family

## 2013-02-17 ENCOUNTER — Ambulatory Visit (INDEPENDENT_AMBULATORY_CARE_PROVIDER_SITE_OTHER): Payer: No Typology Code available for payment source | Admitting: Family

## 2013-02-17 VITALS — BP 96/62 | HR 118 | Temp 98.1°F | Wt 138.0 lb

## 2013-02-17 DIAGNOSIS — R0602 Shortness of breath: Secondary | ICD-10-CM

## 2013-02-17 DIAGNOSIS — F430 Acute stress reaction: Secondary | ICD-10-CM

## 2013-02-17 DIAGNOSIS — M25512 Pain in left shoulder: Secondary | ICD-10-CM

## 2013-02-17 DIAGNOSIS — M25519 Pain in unspecified shoulder: Secondary | ICD-10-CM

## 2013-02-17 DIAGNOSIS — J069 Acute upper respiratory infection, unspecified: Secondary | ICD-10-CM

## 2013-02-17 LAB — CBC WITH DIFFERENTIAL/PLATELET
BASOS ABS: 0 10*3/uL (ref 0.0–0.1)
Basophils Relative: 0.3 % (ref 0.0–3.0)
EOS ABS: 0.1 10*3/uL (ref 0.0–0.7)
EOS PCT: 0.4 % (ref 0.0–5.0)
HEMATOCRIT: 40.2 % (ref 36.0–46.0)
Hemoglobin: 12.9 g/dL (ref 12.0–15.0)
LYMPHS ABS: 2.4 10*3/uL (ref 0.7–4.0)
Lymphocytes Relative: 16.3 % (ref 12.0–46.0)
MCHC: 32.2 g/dL (ref 30.0–36.0)
MCV: 91.8 fl (ref 78.0–100.0)
MONO ABS: 1.2 10*3/uL — AB (ref 0.1–1.0)
Monocytes Relative: 8.4 % (ref 3.0–12.0)
NEUTROS PCT: 74.6 % (ref 43.0–77.0)
Neutro Abs: 11.1 10*3/uL — ABNORMAL HIGH (ref 1.4–7.7)
Platelets: 235 10*3/uL (ref 150.0–400.0)
RBC: 4.38 Mil/uL (ref 3.87–5.11)
RDW: 14 % (ref 11.5–14.6)
WBC: 14.8 10*3/uL — ABNORMAL HIGH (ref 4.5–10.5)

## 2013-02-17 MED ORDER — DIAZEPAM 5 MG PO TABS: 5.0000 mg | ORAL_TABLET | Freq: Two times a day (BID) | ORAL | Status: DC | PRN

## 2013-02-17 MED ORDER — ALBUTEROL SULFATE HFA 108 (90 BASE) MCG/ACT IN AERS: 2.0000 | INHALATION_SPRAY | Freq: Four times a day (QID) | RESPIRATORY_TRACT | Status: DC | PRN

## 2013-02-17 NOTE — Progress Notes (Signed)
Subjective:    Patient ID: Kristen DrossMichelle R Stuart, female    DOB: 1970/11/28, 43 y.o.   MRN: [id number]  Shortness of Breath Pertinent negatives include no chest pain or wheezing.    43 year old white female, smoker, patient of Dr. Tawanna Coolerodd is in today with complaints of cough, congestion, shortness of breath x2 days. Denies any wheezing. He reports pain in her left shoulder that she describes as a stabbing sensation. The pain typically is worse when she is lying down. Reports when anxious. Has not slept well over the last 2 days. Reports increased stress at work. Has been taking TheraFlu and vitamin C without relief of her symptoms. She was seen in the emergency department last month for chest pain and left shoulder pain. Never had a followup from that visit but x-rays were normal.  Review of Systems  HENT: Negative.   Respiratory: Positive for cough and shortness of breath. Negative for wheezing.   Cardiovascular: Negative.  Negative for chest pain.  Gastrointestinal: Negative.   Endocrine: Negative.   Genitourinary: Negative.   Musculoskeletal: Negative for back pain and myalgias.       Left shoulder pain worse with coughing  Skin: Negative.   Neurological: Negative.   Psychiatric/Behavioral: Negative.    Past Medical History  Diagnosis Date  . Asthma   . Thyroid disease     History   Social History  . Marital Status: Married    Spouse Name: N/A    Number of Children: N/A  . Years of Education: N/A   Occupational History  . Not on file.   Social History Main Topics  . Smoking status: Current Every Day Smoker  . Smokeless tobacco: Not on file  . Alcohol Use: Yes  . Drug Use: No  . Sexual Activity: Not on file   Other Topics Concern  . Not on file   Social History Narrative  . No narrative on file    Past Surgical History  Procedure Laterality Date  . Tonsillectomy    . Breast lumpectomy      L 12 o'clock    Family History  Problem Relation Age of Onset  .  Diabetes Mother   . Hypertension Mother   . Hyperlipidemia Mother   . Depression Mother   . Cancer Father     Pancreatic     Allergies  Allergen Reactions  . Fish Allergy Anaphylaxis  . Shellfish Allergy Anaphylaxis  . Aspirin Other (See Comments)    REACTION: gi upset  . Cephalexin Other (See Comments)    "makes me crazy"  . Prednisone Other (See Comments)    "makes me crazy"    Current Outpatient Prescriptions on File Prior to Visit  Medication Sig Dispense Refill  . aspirin 81 MG chewable tablet Chew 162 mg by mouth once as needed (chest pain).      . methimazole (TAPAZOLE) 10 MG tablet Take 10 mg by mouth every morning.      . metoprolol succinate (TOPROL-XL) 25 MG 24 hr tablet Take 1 tablet (25 mg total) by mouth daily.  90 tablet  3  . HYDROcodone-acetaminophen (VICODIN ES) 7.5-750 MG per tablet Take 1 tablet by mouth every 6 (six) hours as needed for pain.       No current facility-administered medications on file prior to visit.    BP 96/62  Pulse 118  Temp(Src) 98.1 F (36.7 C) (Oral)  Wt 138 lb (62.596 kg)  LMP 12/27/2014chart    Objective:   Physical  Exam  Constitutional: She is oriented to person, place, and time. She appears well-developed and well-nourished.  HENT:  Right Ear: External ear normal.  Left Ear: External ear normal.  Mouth/Throat: Oropharynx is clear and moist.  Neck: Normal range of motion. Neck supple.  Cardiovascular: Normal rate, regular rhythm and normal heart sounds.   Pulmonary/Chest: Effort normal and breath sounds normal. She has no wheezes. She has no rales.  Musculoskeletal: Normal range of motion.  Tenderness to palpation of the left shoulder. Full range of motion without pain  Neurological: She is alert and oriented to person, place, and time.  Skin: Skin is warm and dry.  Psychiatric:  Anxious          Assessment & Plan:   Kristen Stuart was seen today for shortness of breath.  Diagnoses and associated orders for this  visit:  Acute upper respiratory infections of unspecified site  Acute stress reaction - CBC with Differential  Left shoulder pain  Other Orders - diazepam (VALIUM) 5 MG tablet; Take 1 tablet (5 mg total) by mouth every 12 (twelve) hours as needed for anxiety. - albuterol (PROVENTIL HFA;VENTOLIN HFA) 108 (90 BASE) MCG/ACT inhaler; Inhale 2 puffs into the lungs every 6 (six) hours as needed for wheezing or shortness of breath.    Truly believe in symptoms are anxiety related. She is very anxious today. Chest x-ray was normal prior to now. No reason to x-ray her chest based on her exam today. CBC will be sent. Will notify patient of results. In the meantime we'll prescribe Valium 5 mg to help with shortness of breath and hopefully left shoulder pain. She probably needs to be on an SSRI ultimately.

## 2013-02-17 NOTE — Patient Instructions (Signed)
Stress Stress-related medical problems are becoming increasingly common. The body has a built-in physical response to stressful situations. Faced with pressure, challenge or danger, we need to react quickly. Our bodies release hormones such as cortisol and adrenaline to help do this. These hormones are part of the "fight or flight" response and affect the metabolic rate, heart rate and blood pressure, resulting in a heightened, stressed state that prepares the body for optimum performance in dealing with a stressful situation. It is likely that early man required these mechanisms to stay alive, but usually modern stresses do not call for this, and the same hormones released in today's world can damage health and reduce coping ability. CAUSES  Pressure to perform at work, at school or in sports.  Threats of physical violence.  Money worries.  Arguments.  Family conflicts.  Divorce or separation from significant other.  Bereavement.  New job or unemployment.  Changes in location.  Alcohol or drug abuse. SOMETIMES, THERE IS NO PARTICULAR REASON FOR DEVELOPING STRESS. Almost all people are at risk of being stressed at some time in their lives. It is important to know that some stress is temporary and some is long term.  Temporary stress will go away when a situation is resolved. Most people can cope with short periods of stress, and it can often be relieved by relaxing, taking a walk, chatting through issues with friends, or having a good night's sleep.  Chronic (long-term, continuous) stress is much harder to deal with. It can be psychologically and emotionally damaging. It can be harmful both for an individual and for friends and family. SYMPTOMS Everyone reacts to stress differently. There are some common effects that help us recognize it. In times of extreme stress, people may:  Shake uncontrollably.  Breathe faster and deeper than normal (hyperventilate).  Vomit.  For people  with asthma, stress can trigger an attack.  For some people, stress may trigger migraine headaches, ulcers, and body pain. PHYSICAL EFFECTS OF STRESS MAY INCLUDE:  Loss of energy.  Skin problems.  Aches and pains resulting from tense muscles, including neck ache, backache and tension headaches.  Increased pain from arthritis and other conditions.  Irregular heart beat (palpitations).  Periods of irritability or anger.  Apathy or depression.  Anxiety (feeling uptight or worrying).  Unusual behavior.  Loss of appetite.  Comfort eating.  Lack of concentration.  Loss of, or decreased, sex-drive.  Increased smoking, drinking, or recreational drug use.  For women, missed periods.  Ulcers, joint pain, and muscle pain. Post-traumatic stress is the stress caused by any serious accident, strong emotional damage, or extremely difficult or violent experience such as rape or war. Post-traumatic stress victims can experience mixtures of emotions such as fear, shame, depression, guilt or anger. It may include recurrent memories or images that may be haunting. These feelings can last for weeks, months or even years after the traumatic event that triggered them. Specialized treatment, possibly with medicines and psychological therapies, is available. If stress is causing physical symptoms, severe distress or making it difficult for you to function as normal, it is worth seeing your caregiver. It is important to remember that although stress is a usual part of life, extreme or prolonged stress can lead to other illnesses that will need treatment. It is better to visit a doctor sooner rather than later. Stress has been linked to the development of high blood pressure and heart disease, as well as insomnia and depression. There is no diagnostic test for   stress since everyone reacts to it differently. But a caregiver will be able to spot the physical symptoms, such  as:  Headaches.  Shingles.  Ulcers. Emotional distress such as intense worry, low mood or irritability should be detected when the doctor asks pertinent questions to identify any underlying problems that might be the cause. In case there are physical reasons for the symptoms, the doctor may also want to do some tests to exclude certain conditions. If you feel that you are suffering from stress, try to identify the aspects of your life that are causing it. Sometimes you may not be able to change or avoid them, but even a small change can have a positive ripple effect. A simple lifestyle change can make all the difference. STRATEGIES THAT CAN HELP DEAL WITH STRESS:  Delegating or sharing responsibilities.  Avoiding confrontations.  Learning to be more assertive.  Regular exercise.  Avoid using alcohol or street drugs to cope.  Eating a healthy, balanced diet, rich in fruit and vegetables and proteins.  Finding humor or absurdity in stressful situations.  Never taking on more than you know you can handle comfortably.  Organizing your time better to get as much done as possible.  Talking to friends or family and sharing your thoughts and fears.  Listening to music or relaxation tapes.  Tensing and then relaxing your muscles, starting at the toes and working up to the head and neck. If you think that you would benefit from help, either in identifying the things that are causing your stress or in learning techniques to help you relax, see a caregiver who is capable of helping you with this. Rather than relying on medications, it is usually better to try and identify the things in your life that are causing stress and try to deal with them. There are many techniques of managing stress including counseling, psychotherapy, aromatherapy, yoga, and exercise. Your caregiver can help you determine what is best for you. Document Released: 03/11/2002 Document Revised: 03/13/2011 Document  Reviewed: 02/05/2007 [employer] Patient Information 2014 [employer].  

## 2013-02-17 NOTE — Progress Notes (Signed)
Pre visit review using our clinic review tool, if applicable. No additional management support is needed unless otherwise documented below in the visit note. 

## 2013-02-19 ENCOUNTER — Telehealth: Payer: Self-pay | Admitting: Family Medicine

## 2013-02-19 NOTE — Telephone Encounter (Signed)
Relevant patient education assigned to patient using Emmi. ° °

## 2013-03-03 ENCOUNTER — Other Ambulatory Visit: Payer: Self-pay

## 2013-03-03 ENCOUNTER — Ambulatory Visit (INDEPENDENT_AMBULATORY_CARE_PROVIDER_SITE_OTHER)
Admission: RE | Admit: 2013-03-03 | Discharge: 2013-03-03 | Disposition: A | Discharge status: Home / Self Care | Payer: No Typology Code available for payment source | Source: Ambulatory Visit | Attending: Family | Admitting: Family

## 2013-03-03 DIAGNOSIS — R0602 Shortness of breath: Secondary | ICD-10-CM

## 2013-03-03 MED ORDER — DOXYCYCLINE HYCLATE 100 MG PO TABS: 100.0000 mg | ORAL_TABLET | Freq: Two times a day (BID) | ORAL | Status: DC

## 2013-03-04 ENCOUNTER — Telehealth: Payer: Self-pay | Admitting: Family Medicine

## 2013-03-04 MED ORDER — TRAMADOL HCL 50 MG PO TABS: 50.0000 mg | ORAL_TABLET | Freq: Three times a day (TID) | ORAL | Status: DC | PRN

## 2013-03-04 NOTE — Telephone Encounter (Signed)
Pt is still having shoulder pain and neck pain. Pt was seen on 2- 16-15. [facility] [address]. Please advise

## 2013-03-04 NOTE — Telephone Encounter (Signed)
Tramadol faxed to pharmacy.

## 2013-03-06 ENCOUNTER — Telehealth: Payer: Self-pay | Admitting: Family Medicine

## 2013-03-06 DIAGNOSIS — R0602 Shortness of breath: Secondary | ICD-10-CM

## 2013-03-06 NOTE — Telephone Encounter (Signed)
Patient Information:  Caller Name: Kristen Stuart  Phone: [phone]  Patient: Kristen Stuart, Kristen Stuart  Gender: Female  DOB: 06/03/1970  Age: 43 Years  PCP: Kelle Dartingodd, [facility](Family Practice)  Pregnant: No  Office Follow Up:  Does the office need to follow up with this patient?: Yes  Instructions For The Office: Please contact patient to give further instructions.  RN Note:  Patient reports her symptoms have not improved since being seen in the office and starting antibiotics. She would like to know if the doctor feels she needs to be seen again or if she needs to switch the type of medicine she is currently taking. Please contact patient with further instructions.  Symptoms  Reason For Call & Symptoms: Fever, urgency, frequency with urination, bad odor to urine. Currently taking antibiotics for upper respiratory symptoms. Reports when she lays down she feels short of breath; also feels like her thyroid is swollen; feels pain when taking deep breaths.  Reviewed Health History In EMR: Yes  Reviewed Medications In EMR: Yes  Reviewed Allergies In EMR: Yes  Reviewed Surgeries / Procedures: Yes  Date of Onset of Symptoms: 02/17/2013  Any Fever: Yes  Fever Taken: Oral  Fever Time Of Reading: 15:30:41  Fever Last Reading: 102.4 OB / GYN:  LMP: Unknown  Guideline(s) Used:  No Protocol Available - Sick Adult  Disposition Per Guideline:   Discuss with PCP and Callback by Nurse Today  Reason For Disposition Reached:   Nursing judgment  Advice Given:  N/A  Patient Will Follow Care Advice:  YES

## 2013-03-07 ENCOUNTER — Ambulatory Visit (INDEPENDENT_AMBULATORY_CARE_PROVIDER_SITE_OTHER)
Admission: RE | Admit: 2013-03-07 | Discharge: 2013-03-07 | Disposition: A | Discharge status: Home / Self Care | Payer: No Typology Code available for payment source | Source: Ambulatory Visit | Attending: Family | Admitting: Family

## 2013-03-07 ENCOUNTER — Other Ambulatory Visit: Payer: Self-pay | Admitting: Family

## 2013-03-07 DIAGNOSIS — I3139 Other pericardial effusion (noninflammatory): Secondary | ICD-10-CM

## 2013-03-07 DIAGNOSIS — I313 Pericardial effusion (noninflammatory): Secondary | ICD-10-CM

## 2013-03-07 DIAGNOSIS — R0602 Shortness of breath: Secondary | ICD-10-CM

## 2013-03-07 MED ORDER — IOHEXOL 350 MG/ML SOLN
80.0000 mL | Freq: Once | INTRAVENOUS | Status: AC | PRN
  Administered 2013-03-07: 80 mL via INTRAVENOUS

## 2013-03-07 NOTE — Telephone Encounter (Signed)
Ct of chest to rule out PE. Advised patient.

## 2013-03-14 ENCOUNTER — Ambulatory Visit (INDEPENDENT_AMBULATORY_CARE_PROVIDER_SITE_OTHER): Payer: No Typology Code available for payment source | Admitting: Cardiovascular Disease

## 2013-03-14 ENCOUNTER — Encounter: Payer: Self-pay | Admitting: Cardiovascular Disease

## 2013-03-14 VITALS — BP 122/71 | HR 85 | Ht 63.0 in | Wt 143.0 lb

## 2013-03-14 DIAGNOSIS — I3139 Other pericardial effusion (noninflammatory): Secondary | ICD-10-CM | POA: Insufficient documentation

## 2013-03-14 DIAGNOSIS — I313 Pericardial effusion (noninflammatory): Secondary | ICD-10-CM

## 2013-03-14 DIAGNOSIS — I319 Disease of pericardium, unspecified: Secondary | ICD-10-CM

## 2013-03-14 NOTE — Progress Notes (Signed)
03/14/2013 Kristen DrossMichelle R Stuart   December 06, 1970  [id number]  Primary Physician TODD,JEFFREY Freida BusmanALLEN, MD Primary Cardiologist: Runell GessJonathan J. [place] Frieden MD Roseanne RenoFACP,FACC,FAHA, FSCAI   HPI:  Ms. Kristen GlazierMcKiernan is a delightful 43 year old mildly overweight easily separated Caucasian female mother of 2 children who is referred by  Adline MangoPadonda Campbell nurse practitioner for evaluation of a pericardial effusion. Her cardiac risk factor profile is only positive for 30 pack years of tobacco abuse. Otherwise is not diabetic, hypertensive or hyperlipidemic. There is no family history of heart disease. She's never had a heart attack or stroke. 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 Her only other medical problems are hypothyroidism on a beta blocker. She developed an upper respiratory tract infection in mid February. This was characterized by a febrile illness, shortness of breath, pain in her left neck shoulder and arm. She was treated with an antibiotic. A CT angiogram revealed no evidence of embolus but there was a moderate pericardial effusion. Her symptoms have been improving over time.   Current Outpatient Prescriptions  Medication Sig Dispense Refill  . albuterol (PROVENTIL HFA;VENTOLIN HFA) 108 (90 BASE) MCG/ACT inhaler Inhale 2 puffs into the lungs every 6 (six) hours as needed for wheezing or shortness of breath.  1 Inhaler  0  . metoprolol succinate (TOPROL-XL) 25 MG 24 hr tablet Take 1 tablet (25 mg total) by mouth daily.  90 tablet  3   No current facility-administered medications for this visit.    Allergies  Allergen Reactions  . Fish Allergy Anaphylaxis  . Shellfish Allergy Anaphylaxis  . Aspirin Other (See Comments)    REACTION: gi upset  . Cephalexin Other (See Comments)    "makes me crazy"  . Prednisone Other (See Comments)    "makes me crazy"    History   Social History  . Marital Status: Married    Spouse Name: N/A    Number of Children: N/A  .  Years of Education: N/A   Occupational History  . Not on file.   Social History Main Topics  . Smoking status: Current Every Day Smoker  . Smokeless tobacco: Not on file  . Alcohol Use: Yes  . Drug Use: No  . Sexual Activity: Not on file   Other Topics Concern  . Not on file   Social History Narrative  . No narrative on file     Review of Systems: General: negative for chills, fever, night sweats or weight changes.  Cardiovascular: negative for chest pain, dyspnea on exertion, edema, orthopnea, palpitations, paroxysmal nocturnal dyspnea or shortness of breath Dermatological: negative for rash Respiratory: negative for cough or wheezing Urologic: negative for hematuria Abdominal: negative for nausea, vomiting, diarrhea, bright red blood per rectum, melena, or hematemesis Neurologic: negative for visual changes, syncope, or dizziness All other systems reviewed and are otherwise negative except as noted above.    Blood pressure 122/71, pulse 85, height 5\' 3"  (1.6 m), weight 143 lb (64.864 kg), last menstrual period 02/10/2013.  General appearance: alert and no distress Neck: no adenopathy, no carotid bruit, no JVD, supple, symmetrical, trachea midline and thyroid not enlarged, symmetric, no tenderness/mass/nodules Lungs: clear to auscultation bilaterally Heart: regular rate and rhythm, S1, S2 normal, no murmur, click, rub or gallop Abdomen: soft, non-tender; bowel sounds normal; no masses,  no organomegaly Extremities: extremities normal, atraumatic, no cyanosis or edema and 2+ pedal pulses bilaterally  EKG not performed today  ASSESSMENT AND PLAN:   Pericardial effusion Patient had an  upper respiratory tract infection in mid February. This is associated with fever shortness of breath and pain in her left neck shoulder and upper extremity. She was treated with antibiotics antibiotics after which a chest x-ray was obtained. A CT angiogram revealed no evidence of pulmonary  embolus but did show moderate pericardial effusion. Her symptoms are resolving. Because of this she was referred for further evaluation. Her exam is benign. I do not hear a rub her neck into her clot. She is very active and runs 4-5 times a week and does yoga. In going to get a 2-D echocardiogram to further evaluate her. I suspect is related to her viral illness and probably is much less than the CT scan suggested.      Runell Gess MD FACP,FACC,FAHA, [facility] 03/14/2013 1:10 PM

## 2013-03-14 NOTE — Patient Instructions (Signed)
Your physician has requested that you have an echocardiogram. Echocardiography is a painless test that uses sound waves to create images of your heart. It provides your doctor with information about the size and shape of your heart and how well your heart's chambers and valves are working. This procedure takes approximately one hour. There are no restrictions for this procedure.  Your physician recommends that you schedule a follow-up appointment as needed.  We will contact you with the results of this study.

## 2013-03-14 NOTE — Assessment & Plan Note (Signed)
Patient had an upper respiratory tract infection in mid February. This is associated with fever shortness of breath and pain in her left neck shoulder and upper extremity. She was treated with antibiotics antibiotics after which a chest x-ray was obtained. A CT angiogram revealed no evidence of pulmonary embolus but did show moderate pericardial effusion. Her symptoms are resolving. Because of this she was referred for further evaluation. Her exam is benign. I do not hear a rub her neck into her clot. She is very active and runs 4-5 times a week and does yoga. In going to get a 2-D echocardiogram to further evaluate her. I suspect is related to her viral illness and probably is much less than the CT scan suggested.

## 2013-03-24 ENCOUNTER — Telehealth: Payer: Self-pay | Admitting: Family Medicine

## 2013-03-24 ENCOUNTER — Encounter: Payer: Self-pay | Admitting: Family Medicine

## 2013-03-24 ENCOUNTER — Encounter (HOSPITAL_COMMUNITY): Payer: PRIVATE HEALTH INSURANCE

## 2013-03-24 ENCOUNTER — Ambulatory Visit (INDEPENDENT_AMBULATORY_CARE_PROVIDER_SITE_OTHER): Payer: No Typology Code available for payment source | Admitting: Family Medicine

## 2013-03-24 VITALS — BP 110/80 | HR 113 | Temp 98.8°F | Wt 139.0 lb

## 2013-03-24 DIAGNOSIS — F17200 Nicotine dependence, unspecified, uncomplicated: Secondary | ICD-10-CM

## 2013-03-24 DIAGNOSIS — I313 Pericardial effusion (noninflammatory): Secondary | ICD-10-CM

## 2013-03-24 DIAGNOSIS — I3139 Other pericardial effusion (noninflammatory): Secondary | ICD-10-CM

## 2013-03-24 DIAGNOSIS — I319 Disease of pericardium, unspecified: Secondary | ICD-10-CM

## 2013-03-24 DIAGNOSIS — J45909 Unspecified asthma, uncomplicated: Secondary | ICD-10-CM

## 2013-03-24 MED ORDER — PREDNISONE 20 MG PO TABS: ORAL_TABLET | ORAL | Status: DC

## 2013-03-24 NOTE — Telephone Encounter (Signed)
Patient Information:  Caller Name: Kristen Stuart  Phone: [phone]  Patient: Kristen Stuart, Kristen Stuart  Gender: Female  DOB: 1970/09/25  Age: 43 Years  PCP: Kelle Dartingodd, Jeffrey [facility](Family Practice)  Pregnant: No  Office Follow Up:  Does the office need to follow up with this patient?: No  Instructions For The Office: N/A  RN Note:  Reviewed adult dose for Ibuprofen.  Care advice given.  Will schedule appt.  Symptoms  Reason For Call & Symptoms: Calling due to continued chest pain;  Woke her up thru the night;  Pain with taking a deep breath to the middle of chest slightly to left, the pain seems to always be there but deep breathing makes it worse;  Still has a productive cough with yellowish flem; Afebrile this am; She did have a temp of 100.8 orally 03/21/13;  She has not used anything for the pain;  This morning she feels like "she has been hit by a truck", aching all over and the painful deep breathing.  She does have and Echocardiogram scheduled for 03/31/13 but feels no improvement in symptoms since completing abx's and concerned that her infection has not cleared.  Reviewed Health History In EMR: Yes  Reviewed Medications In EMR: Yes  Reviewed Allergies In EMR: Yes  Reviewed Surgeries / Procedures: Yes  Date of Onset of Symptoms: 03/24/2013 OB / GYN:  LMP: 03/17/2013  Guideline(s) Used:  Chest Pain  Disposition Per Guideline:   Go to ED Now (or to Office with PCP Approval)  Reason For Disposition Reached:   Intermittent chest pain and pain has been increasing in severity or frequency  Advice Given:  Fleeting Chest Pain:  Fleeting chest pains that last only a few seconds and then go away are generally not serious. They may be from pinched muscles or nerves in your chest wall.  Call Back If:  Severe chest pain  Constant chest pain lasting longer than 5 minutes  Difficulty breathing  Fever  You become worse.  Patient Will Follow Care Advice:  YES  Appointment Scheduled:  03/24/2013  11:45:00 Appointment Scheduled Provider:  Kelle Dartingodd, Jeffrey [facility](Family Practice)

## 2013-03-24 NOTE — Patient Instructions (Signed)
Prednisone 20 mg....... 2 tabs x3 days or until you feel a lot better....... then taper as outlined  Return when necessary

## 2013-03-24 NOTE — Progress Notes (Signed)
Pre visit review using our clinic review tool, if applicable. No additional management support is needed unless otherwise documented below in the visit note. 

## 2013-03-24 NOTE — Progress Notes (Signed)
   Subjective:    Patient ID: Kristen Stuart, female    DOB: 1970-10-14, 43 y.o.   MRN: [id number]  HPI Kristen Stuart is a 43 year old female..... X. smoker times one month since she's been sick....... who comes in today for evaluation of a cough  She was originally seen here with a cough. She had a abnormal chest x-ray and was treated appropriately with doxycycline 100 mg twice a day. He also noticed a possible pericardial effusion. She had a CT scan which showed the effusion and she's been to cardiology. She's to go back on March 30 for a followup echo.  The cough persists and now she feels tightness in the chest. Again she's been an ex-smoker now for only one month. She's had a history of allergic rhinitis and asthma in the past   Review of Systems    review of systems negative except she's undergoing treatment for hyperthyroidism Objective:   Physical Exam  Well-developed well-nourished female no acute distress vital signs stable she's afebrile HEENT negative neck was supple no adenopathy lungs are clear except for symmetrical late expiratory wheezing      Assessment & Plan:  Asthma//////

## 2013-03-25 ENCOUNTER — Encounter (HOSPITAL_COMMUNITY): Payer: PRIVATE HEALTH INSURANCE

## 2013-03-31 ENCOUNTER — Ambulatory Visit (HOSPITAL_COMMUNITY)
Admission: RE | Admit: 2013-03-31 | Discharge: 2013-03-31 | Disposition: A | Discharge status: Home / Self Care | Payer: No Typology Code available for payment source | Source: Ambulatory Visit | Attending: Cardiovascular Disease | Admitting: Cardiovascular Disease

## 2013-03-31 ENCOUNTER — Institutional Professional Consult (permissible substitution): Payer: No Typology Code available for payment source | Admitting: Cardiology

## 2013-03-31 DIAGNOSIS — I3139 Other pericardial effusion (noninflammatory): Secondary | ICD-10-CM

## 2013-03-31 DIAGNOSIS — I519 Heart disease, unspecified: Secondary | ICD-10-CM

## 2013-03-31 DIAGNOSIS — I319 Disease of pericardium, unspecified: Secondary | ICD-10-CM | POA: Insufficient documentation

## 2013-03-31 DIAGNOSIS — I313 Pericardial effusion (noninflammatory): Secondary | ICD-10-CM

## 2013-03-31 NOTE — Progress Notes (Signed)
  Echocardiogram 2D Echocardiogram has been performed.  Jorje GuildCHUNG, Gaylene Moylan 03/31/2013, 8:35 AM

## 2013-04-21 ENCOUNTER — Ambulatory Visit (HOSPITAL_COMMUNITY): Payer: No Typology Code available for payment source

## 2013-04-22 ENCOUNTER — Encounter (HOSPITAL_COMMUNITY): Payer: No Typology Code available for payment source

## 2013-04-28 ENCOUNTER — Other Ambulatory Visit: Payer: Self-pay

## 2013-04-28 MED ORDER — METHIMAZOLE 10 MG PO TABS: ORAL_TABLET | ORAL | Status: DC

## 2013-04-28 NOTE — Telephone Encounter (Signed)
Received fax from pharmacy requested a refill on Tapazole.  Medication refilled.

## 2013-05-08 ENCOUNTER — Telehealth: Payer: Self-pay | Admitting: *Deleted

## 2013-05-08 DIAGNOSIS — R931 Abnormal findings on diagnostic imaging of heart and coronary circulation: Secondary | ICD-10-CM

## 2013-05-08 NOTE — Telephone Encounter (Signed)
Message copied by Marella BileVOGEL, Danyale Ridinger W. on Thu May 08, 2013  6:07 PM ------      Message from: Runell GessBERRY, JONATHAN J      Created: Thu May 08, 2013 10:38 AM      Regarding: RE: echo result       2D shows nl LV FXN, no pericardial effusion. The reader suggested a dedicated IVC US to R/O Mass (not sure why but should probably follow recommendation). The echo was being done to R/O Pericardial effusion            JJB      ----- Message -----         From: Marella BileKathryn W Adalid Beckmann, RN         Sent: 05/08/2013  10:24 AM           To: Runell GessJonathan J Berry, MD      Subject: echo result                                              Please review this patient's echo!!!       ------

## 2013-05-09 ENCOUNTER — Other Ambulatory Visit: Payer: Self-pay | Admitting: *Deleted

## 2013-05-09 DIAGNOSIS — R931 Abnormal findings on diagnostic imaging of heart and coronary circulation: Secondary | ICD-10-CM

## 2013-05-09 NOTE — Telephone Encounter (Signed)
Returning your call. °

## 2013-05-09 NOTE — Telephone Encounter (Signed)
I spoke with patient and reviewed results.  She is agreeable to proceed with ultrasound of IVC.   I will contact the schedulers.

## 2013-05-09 NOTE — Telephone Encounter (Signed)
Order placed for ultrasound of IVC

## 2013-05-14 ENCOUNTER — Ambulatory Visit (HOSPITAL_COMMUNITY)
Admission: RE | Admit: 2013-05-14 | Discharge: 2013-05-14 | Disposition: A | Discharge status: Home / Self Care | Payer: No Typology Code available for payment source | Source: Ambulatory Visit | Attending: Cardiovascular Disease | Admitting: Cardiovascular Disease

## 2013-05-14 DIAGNOSIS — R9389 Abnormal findings on diagnostic imaging of other specified body structures: Secondary | ICD-10-CM | POA: Insufficient documentation

## 2013-05-14 DIAGNOSIS — R931 Abnormal findings on diagnostic imaging of heart and coronary circulation: Secondary | ICD-10-CM

## 2013-05-15 ENCOUNTER — Encounter (HOSPITAL_COMMUNITY): Payer: No Typology Code available for payment source

## 2013-09-18 ENCOUNTER — Other Ambulatory Visit: Payer: Self-pay | Admitting: Ophthalmology

## 2013-09-18 DIAGNOSIS — H532 Diplopia: Secondary | ICD-10-CM

## 2013-09-18 DIAGNOSIS — E0590 Thyrotoxicosis, unspecified without thyrotoxic crisis or storm: Secondary | ICD-10-CM

## 2013-09-23 ENCOUNTER — Ambulatory Visit
Admission: RE | Admit: 2013-09-23 | Discharge: 2013-09-23 | Disposition: A | Discharge status: Home / Self Care | Payer: No Typology Code available for payment source | Source: Ambulatory Visit | Attending: Ophthalmology | Admitting: Ophthalmology

## 2013-09-23 DIAGNOSIS — H532 Diplopia: Secondary | ICD-10-CM

## 2013-09-23 DIAGNOSIS — E0590 Thyrotoxicosis, unspecified without thyrotoxic crisis or storm: Secondary | ICD-10-CM

## 2013-09-23 MED ORDER — IOHEXOL 300 MG/ML  SOLN
75.0000 mL | Freq: Once | INTRAMUSCULAR | Status: AC | PRN
  Administered 2013-09-23: 75 mL via INTRAVENOUS

## 2013-09-30 ENCOUNTER — Telehealth: Payer: Self-pay | Admitting: Endocrinology

## 2013-09-30 NOTE — Telephone Encounter (Signed)
Ov needed--too long since last ov

## 2013-09-30 NOTE — Telephone Encounter (Signed)
See below, last time pt was here was 2014.  Thanks!

## 2013-09-30 NOTE — Telephone Encounter (Signed)
Fax over Radioactive Treatment order to [facility] to Dr. Chipper Herbobert Murray Fax # [phone]

## 2013-10-01 NOTE — Telephone Encounter (Signed)
Contacted pt and advised that an office visit would be needed before orders can be done. Pt coming for ov on 10/03/2013 at 3:30

## 2013-10-02 ENCOUNTER — Encounter: Payer: Self-pay | Admitting: Radiation Oncology

## 2013-10-02 ENCOUNTER — Ambulatory Visit
Admission: RE | Admit: 2013-10-02 | Discharge: 2013-10-02 | Disposition: A | Discharge status: Home / Self Care | Payer: Self-pay | Source: Ambulatory Visit | Attending: Radiation Oncology | Admitting: Radiation Oncology

## 2013-10-02 VITALS — BP 139/82 | HR 104 | Temp 98.5°F | Resp 20 | Ht 63.0 in | Wt 155.7 lb

## 2013-10-02 DIAGNOSIS — H05839 Thyroid orbitopathy, unspecified orbit: Secondary | ICD-10-CM | POA: Insufficient documentation

## 2013-10-02 DIAGNOSIS — E0500 Thyrotoxicosis with diffuse goiter without thyrotoxic crisis or storm: Secondary | ICD-10-CM

## 2013-10-02 DIAGNOSIS — Z510 Encounter for antineoplastic radiation therapy: Secondary | ICD-10-CM | POA: Insufficient documentation

## 2013-10-02 HISTORY — DX: Thyrotoxicosis with diffuse goiter without thyrotoxic crisis or storm: E05.00

## 2013-10-02 NOTE — Progress Notes (Signed)
Patient states she was diagnosed with hyperactive thyroid 1 year ago. Her endocrinologist is Dr Romero BellingSean Ellison who she has not seen for FU recently. She has an appt with him tomorrow. She states she elected not to take radiation therapy at the time of diagnosis. She has been on Methimazole, but states she stopped taking it 3-4 weeks ago as she was told she had to be off the medication 10 days prior to beginning radiation. She denies pain but states she has had vision changes x 6 weeks, progressively getting worse. She states her vision is fine if she looks up or down, but when she looks straight ahead she states she has double vision. Both images are doubled vertically, not horizontally. She denies other problems, symptoms.  She is married. 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   09/23/13 CT Orbits: enlargement of left inferior rectus muscle

## 2013-10-02 NOTE — Progress Notes (Addendum)
[facility] NEW PATIENT EVALUATION  Name: Kristen Stuart MRN: [id number]  Date:   10/02/2013           DOB: 26-Mar-1970  Status: outpatient   CC: TODD,JEFFREY Freida BusmanALLEN, MD  Antony ContrasLyles, Graham, MD , Dr. 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 female who is seen today through the courtesy of Dr. Cheree DittoGraham for discussion of possible radiation therapy in the management of her Graves orbitopathy of the left eye. I understand that she was diagnosed with hyperthyroidism in September 2014. She declined radioactive iodine therapy. She noted a change in her visual acuity approximately 4-6 months ago. This worsened significantly  over the past 6 weeks. More recently she noted diplopia with upward gaze. She has not tried corticosteroids. She was seen by Dr. Antony ContrasGraham Lyles, who obtained CT scans of her orbits on 09/23/2013. She was then have prominent enlargement of the left inferior rectus muscle. She denies orbital pain, but does report increased tearing over the past week.  PREVIOUS RADIATION THERAPY: No   PAST MEDICAL HISTORY:  has a past medical history of Asthma; Tobacco abuse; Pericardial effusion; Proptosis due to thyroid disorder; and Thyroid disease.     PAST SURGICAL HISTORY:  Past Surgical History  Procedure Laterality Date  . Tonsillectomy    . Breast lumpectomy Left 1999    L 12 o'clock, benign     FAMILY HISTORY: family history includes Cancer in her father; Depression in her mother; Diabetes in her mother; Hyperlipidemia in her mother; Hypertension in her mother. her father died of pancreatic cancer at 3158. Her mother apparently had a cardiopulmonary arrest secondary to medication at age 45. A paternal aunt had some type of thyroid disease.   SOCIAL HISTORY:  reports that she has been smoking Cigarettes.  She has  been smoking about 0.00 packs per day for the past 29 years. She does not have any smokeless tobacco history on file. She reports that she drinks alcohol. She reports that she does not use illicit drugs. Separated, 2 children. She works as an Radio producerMS billing administrator.   ALLERGIES: Fish allergy; Shellfish allergy; Aspirin; and Cephalexin   MEDICATIONS:  Current Outpatient Prescriptions  Medication Sig Dispense Refill  . albuterol (PROVENTIL HFA;VENTOLIN HFA) 108 (90 BASE) MCG/ACT inhaler Inhale 2 puffs into the lungs every 6 (six) hours as needed for wheezing or shortness of breath.  1 Inhaler  0  . methimazole (TAPAZOLE) 10 MG tablet 2 tabs twice daily  60 tablet  1  . metoprolol succinate (TOPROL-XL) 25 MG 24 hr tablet Take 1 tablet (25 mg total) by mouth daily.  90 tablet  3   No current facility-administered medications for this encounter.     REVIEW OF SYSTEMS:  Pertinent items are noted in HPI.    PHYSICAL EXAM:  height is 5\' 3"  (1.6 m) and weight is 155 lb 11.2 oz (70.625 kg). Her oral temperature is 98.5 F (36.9 C). Her blood pressure is 139/82 and her pulse is 104. Her respiration is 20.   Alert and oriented 43 year old white female appearing her stated age. Eye examination: There is slight proptosis of the left eye. There is diminished superior gaze on the left. There is diplopia except with inferior gaze. There is no palpebral edema or conjunctival hyperemia. Funduscopic examination benign.   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  Interestingly, she has involvement of only the inferior rectus of the left eye. I reviewed the literature, and there does appear to be some benefit to the use of corticosteroids alone, radiation therapy alone, or a combination. Unfortunately, there are no good prospective randomized trials to assess efficacy. I'm not sure of the role and timing of radioactive iodine therapy in the management of her hypothyroidism and how this relates to success with either corticosteroids or radiation therapy. She plans on seeing Dr. Everardo All, her endocrinologist, in the near future and this can be addressed.. The largest most recent series in our literature is from the [facility]. This is a retrospective review and a single institution which does show apparent benefit with relatively low dose radiation therapy (2000 cGy in 10 sessions). Long-term side effects are quite unusual. From a technical standpoint, I think it would be reasonable to treat only the affected left orbit. I would like for Dr. Everardo All and Dr. Antony Contras to determine whether not she should be given a trial of corticosteroids first, to perhaps avoid low-dose radiation therapy. Lastly, there is  literature to suggest that smokers do more poorly with radiation therapy compared to nonsmoker's. I recommend that she stop smoking.    PLAN: As discussed above. I can see the patient back if she fails corticosteroid therapy, or if she is felt to need low-dose radiation therapy at this time considering other alternatives. I spent 40  minutes face to face with the patient and more than 50% of that time was spent in counseling and/or coordination of care.

## 2013-10-02 NOTE — Progress Notes (Signed)
Please see the Nurse Progress Note in the MD Initial Consult Encounter for this patient. 

## 2013-10-03 ENCOUNTER — Encounter: Payer: Self-pay | Admitting: Endocrinology

## 2013-10-03 ENCOUNTER — Ambulatory Visit (INDEPENDENT_AMBULATORY_CARE_PROVIDER_SITE_OTHER): Payer: Self-pay | Admitting: Endocrinology

## 2013-10-03 VITALS — BP 118/78 | HR 88 | Temp 98.2°F | Ht 63.0 in | Wt 156.0 lb

## 2013-10-03 DIAGNOSIS — E0590 Thyrotoxicosis, unspecified without thyrotoxic crisis or storm: Secondary | ICD-10-CM

## 2013-10-03 LAB — T4, FREE: FREE T4: 1.05 ng/dL (ref 0.60–1.60)

## 2013-10-03 LAB — TSH: TSH: 0.01 u[IU]/mL — AB (ref 0.35–4.50)

## 2013-10-03 MED ORDER — METHYLPREDNISOLONE 4 MG PO KIT: PACK | ORAL | Status: DC

## 2013-10-03 NOTE — Patient Instructions (Addendum)
blood tests are being requested for you today.  We'll contact you with results. i have sent a prescription to your pharmacy, for a steroid "pack." let's check a thyroid "scan" (a special, but easy and painless type of thyroid x ray).  It works like this: you go to the x-ray department of the hospital to swallow a pill, which contains a miniscule amount of radiation.  You will not notice any symptoms from this.  You will go back to the x-ray department the next day, to lie down in front of a camera.  The results of this will be sent to me.   Based on the results, i hope to order for you a treatment pill of radioactive iodine.  Although it is a larger amount of radiation, you will again notice no symptoms from this.  The pill is gone from your body in a few days (during which you should stay away from other people), but takes several months to work.  Therefore, please return here approximately 6-8 weeks after the treatment.  This treatment has been available for many years, and the only known side-effect is an underactive thyroid.  It is possible that i would eventually prescribe for you a thyroid hormone pill, which is very inexpensive.  You don't have to worry about side-effects of this thyroid hormone pill, because it is the same molecule your thyroid makes.

## 2013-10-03 NOTE — Progress Notes (Signed)
Subjective:    Patient ID: Kristen DrossMichelle R Stuart, female    DOB: December 03, 1970, 43 y.o.   MRN: [id number]  HPI Pt returns for f/u of hyperthyroidism (dx'ed august of 2014, on a routine blood test; due to tachycardia, tapazole was selected as initial rx; in late 2014, she considered i-131 rx, but then decided to stay with tapazole).  However, she now wants to have I-131 rx.  She has moderate diplopia, and assoc weight gain.  She has been off tapazole and metoprolol x 1 month.  She saw opthal, who ref her to rad oncol. Past Medical History  Diagnosis Date  . Asthma   . Tobacco abuse   . Pericardial effusion   . Proptosis due to thyroid disorder     left eye  . Thyroid disease     hyperthyroidism    Past Surgical History  Procedure Laterality Date  . Tonsillectomy    . Breast lumpectomy Left 1999    L 12 o'clock, benign    History   Social History  . Marital Status: Married    Spouse Name: N/A    Number of Children: N/A  . Years of Education: N/A   Occupational History  . Not on file.   Social History Main Topics  . Smoking status: Current Every Day Smoker -- 29 years    Types: Cigarettes  . Smokeless tobacco: Not on file     Comment: 10/02/13 currently 7 cigs or less a day  . Alcohol Use: Yes     Comment: rarely  . Drug Use: No  . Sexual Activity: Not on file   Other Topics Concern  . Not on file   Social History Narrative  . No narrative on file    Current Outpatient Prescriptions on File Prior to Visit  Medication Sig Dispense Refill  . albuterol (PROVENTIL HFA;VENTOLIN HFA) 108 (90 BASE) MCG/ACT inhaler Inhale 2 puffs into the lungs every 6 (six) hours as needed for wheezing or shortness of breath.  1 Inhaler  0   No current facility-administered medications on file prior to visit.    Allergies  Allergen Reactions  . Fish Allergy Anaphylaxis  . Shellfish Allergy Anaphylaxis  . Aspirin Other (See Comments)    REACTION: gi upset  . Cephalexin Other (See  Comments)    "makes me crazy"    Family History  Problem Relation Age of Onset  . Diabetes Mother   . Hypertension Mother   . Hyperlipidemia Mother   . Depression Mother   . Cancer Father     Pancreatic     BP 118/78  Pulse 88  Temp(Src) 98.2 F (36.8 C) (Oral)  Ht 5\' 3"  (1.6 m)  Wt 156 lb (70.761 kg)  BMI 27.64 kg/m2  SpO2 96%  LMP 09/19/2013    Review of Systems She still has palpitations.  Denies fever    Objective:   Physical Exam VITAL SIGNS:  See vs page.   GENERAL: no distress. eyes: no periorbital swelling; slight left proptosis.  NECK: There is no palpable thyroid enlargement.  No thyroid nodule is palpable.  No palpable lymphadenopathy at the anterior neck.    Radiol: i reviewed CT report    Assessment & Plan:  Hyperthyroidism, persistent Opthalmopathy, new  Patient is advised the following: Patient Instructions  blood tests are being requested for you today.  We'll contact you with results. i have sent a prescription to your pharmacy, for a steroid "pack." let's check a thyroid "scan" (a  special, but easy and painless type of thyroid x ray).  It works like this: you go to the x-ray department of the hospital to swallow a pill, which contains a miniscule amount of radiation.  You will not notice any symptoms from this.  You will go back to the x-ray department the next day, to lie down in front of a camera.  The results of this will be sent to me.   Based on the results, i hope to order for you a treatment pill of radioactive iodine.  Although it is a larger amount of radiation, you will again notice no symptoms from this.  The pill is gone from your body in a few days (during which you should stay away from other people), but takes several months to work.  Therefore, please return here approximately 6-8 weeks after the treatment.  This treatment has been available for many years, and the only known side-effect is an underactive thyroid.  It is possible that i  would eventually prescribe for you a thyroid hormone pill, which is very inexpensive.  You don't have to worry about side-effects of this thyroid hormone pill, because it is the same molecule your thyroid makes.

## 2013-10-09 ENCOUNTER — Other Ambulatory Visit: Payer: Self-pay | Admitting: Endocrinology

## 2013-10-09 ENCOUNTER — Encounter: Payer: Self-pay | Admitting: Endocrinology

## 2013-10-09 MED ORDER — METHYLPREDNISOLONE 4 MG PO KIT: PACK | ORAL | Status: DC

## 2013-11-20 ENCOUNTER — Ambulatory Visit (HOSPITAL_COMMUNITY)
Admission: RE | Admit: 2013-11-20 | Discharge: 2013-11-20 | Disposition: A | Discharge status: Home / Self Care | Payer: Self-pay | Source: Ambulatory Visit | Attending: Endocrinology | Admitting: Endocrinology

## 2013-11-20 DIAGNOSIS — E0500 Thyrotoxicosis with diffuse goiter without thyrotoxic crisis or storm: Secondary | ICD-10-CM | POA: Insufficient documentation

## 2013-11-20 DIAGNOSIS — E0590 Thyrotoxicosis, unspecified without thyrotoxic crisis or storm: Secondary | ICD-10-CM

## 2013-11-21 ENCOUNTER — Other Ambulatory Visit: Payer: Self-pay | Admitting: Endocrinology

## 2013-11-21 ENCOUNTER — Encounter (HOSPITAL_COMMUNITY): Payer: Self-pay

## 2013-11-21 ENCOUNTER — Encounter (HOSPITAL_COMMUNITY)
Admission: RE | Admit: 2013-11-21 | Discharge: 2013-11-21 | Disposition: A | Discharge status: Home / Self Care | Payer: Self-pay | Source: Ambulatory Visit | Attending: Endocrinology | Admitting: Endocrinology

## 2013-11-21 DIAGNOSIS — E0590 Thyrotoxicosis, unspecified without thyrotoxic crisis or storm: Secondary | ICD-10-CM

## 2013-11-21 MED ORDER — SODIUM IODIDE I 131 CAPSULE
11.7000 | Freq: Once | INTRAVENOUS | Status: AC | PRN
  Administered 2013-11-20: 11.7 via ORAL

## 2013-11-21 MED ORDER — SODIUM PERTECHNETATE TC 99M INJECTION
10.6000 | Freq: Once | INTRAVENOUS | Status: AC | PRN
  Administered 2013-11-21: 10.6 via INTRAVENOUS

## 2013-12-11 ENCOUNTER — Encounter (HOSPITAL_COMMUNITY)
Admission: RE | Admit: 2013-12-11 | Discharge: 2013-12-11 | Disposition: A | Discharge status: Home / Self Care | Payer: Self-pay | Source: Ambulatory Visit | Attending: Endocrinology | Admitting: Endocrinology

## 2013-12-11 DIAGNOSIS — E0590 Thyrotoxicosis, unspecified without thyrotoxic crisis or storm: Secondary | ICD-10-CM | POA: Insufficient documentation

## 2013-12-11 LAB — HCG, SERUM, QUALITATIVE: Preg, Serum: NEGATIVE

## 2013-12-11 MED ORDER — SODIUM IODIDE I 131 CAPSULE
20.9000 | Freq: Once | INTRAVENOUS | Status: AC | PRN
  Administered 2013-12-11: 20.9 via ORAL

## 2014-02-19 ENCOUNTER — Ambulatory Visit (INDEPENDENT_AMBULATORY_CARE_PROVIDER_SITE_OTHER): Payer: Self-pay | Admitting: Endocrinology

## 2014-02-19 ENCOUNTER — Encounter: Payer: Self-pay | Admitting: Endocrinology

## 2014-02-19 VITALS — BP 138/80 | HR 86 | Temp 97.6°F | Ht 63.0 in | Wt 165.0 lb

## 2014-02-19 DIAGNOSIS — E0590 Thyrotoxicosis, unspecified without thyrotoxic crisis or storm: Secondary | ICD-10-CM

## 2014-02-19 LAB — T4, FREE: Free T4: 0.16 ng/dL — ABNORMAL LOW (ref 0.60–1.60)

## 2014-02-19 LAB — TSH: TSH: 74.72 u[IU]/mL — ABNORMAL HIGH (ref 0.35–4.50)

## 2014-02-19 MED ORDER — LEVOTHYROXINE SODIUM 125 MCG PO TABS: 125.0000 ug | ORAL_TABLET | Freq: Every day | ORAL | Status: DC

## 2014-02-19 MED ORDER — SYNTHROID 125 MCG PO TABS: 125.0000 ug | ORAL_TABLET | Freq: Every day | ORAL | Status: DC

## 2014-02-19 NOTE — Patient Instructions (Addendum)
blood tests are being requested for you today.  We'll let you know about the results. Please come back for a follow-up appointment in 1 month.   

## 2014-02-19 NOTE — Progress Notes (Signed)
   Subjective:    Patient ID: Kristen Stuart, female    DOB: May 07, 1970, 44 y.o.   MRN: [id number]  HPI Pt returns for f/u of hyperthyroidism (dx'ed august of 2014, on a routine blood test; due to tachycardia, she had tapazole first; she then had i-131 rx in December of 2015).  She has fatigue, cold intolerance, and weight gain.  Past Medical History  Diagnosis Date  . Asthma   . Tobacco abuse   . Pericardial effusion   . Proptosis due to thyroid disorder     left eye  . Thyroid disease     hyperthyroidism    Past Surgical History  Procedure Laterality Date  . Tonsillectomy    . Breast lumpectomy Left 1999    L 12 o'clock, benign    History   Social History  . Marital Status: Married    Spouse Name: N/A  . Number of Children: N/A  . Years of Education: N/A   Occupational History  . Not on file.   Social History Main Topics  . Smoking status: Current Every Day Smoker -- 29 years    Types: Cigarettes  . Smokeless tobacco: Not on file     Comment: 10/02/13 currently 7 cigs or less a day  . Alcohol Use: Yes     Comment: rarely  . Drug Use: No  . Sexual Activity: Not on file   Other Topics Concern  . Not on file   Social History Narrative    Current Outpatient Prescriptions on File Prior to Visit  Medication Sig Dispense Refill  . albuterol (PROVENTIL HFA;VENTOLIN HFA) 108 (90 BASE) MCG/ACT inhaler Inhale 2 puffs into the lungs every 6 (six) hours as needed for wheezing or shortness of breath. (Patient not taking: Reported on 02/19/2014) 1 Inhaler 0  . methylPREDNISolone (MEDROL DOSEPAK) 4 MG tablet follow package directions (Patient not taking: Reported on 02/19/2014) 21 tablet 0   No current facility-administered medications on file prior to visit.    Allergies  Allergen Reactions  . Fish Allergy Anaphylaxis  . Shellfish Allergy Anaphylaxis  . Aspirin Other (See Comments)    REACTION: gi upset  . Cephalexin Other (See Comments)    "makes me crazy"     Family History  Problem Relation Age of Onset  . Diabetes Mother   . Hypertension Mother   . Hyperlipidemia Mother   . Depression Mother   . Cancer Father     Pancreatic     BP 138/80 mmHg  Pulse 86  Temp(Src) 97.6 F (36.4 C) (Oral)  Ht $R'5\' 3"'Ga$  (1.6 m)  Wt 165 lb (74.844 kg)  BMI 29.24 kg/m2  SpO2 98%   Review of Systems She also has dry skin    Objective:   Physical Exam VITAL SIGNS:  See vs page GENERAL: no distress Skin: slightly dry Neuro: no tremor   Lab Results  Component Value Date   TSH 74.72* 02/19/2014      Assessment & Plan:  Post-I-131 hypothyroidism, new  i have sent a prescription to your pharmacy, for synthroid Ret 1-2 mos

## 2014-03-10 IMAGING — MG STANDARD SCREENING - COMBO
8 series · 9 of 24 positions shown · non-contrast
Comparison: 11/20/2005

CLINICAL DATA: Screening.

DIGITAL SCREENING BILATERAL MAMMOGRAM WITH CAD
DIGITAL BREAST TOMOSYNTHESIS
Digital breast tomosynthesis images are acquired in two
projections.  These images are reviewed in combination with the
digital mammogram, confirming the findings below.

[L MLO]
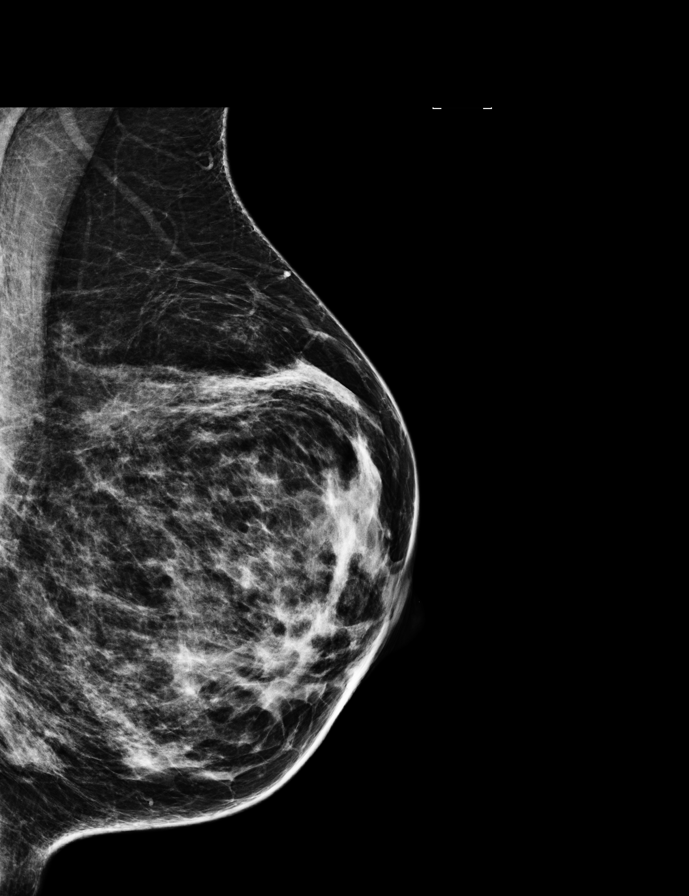

[L CC]
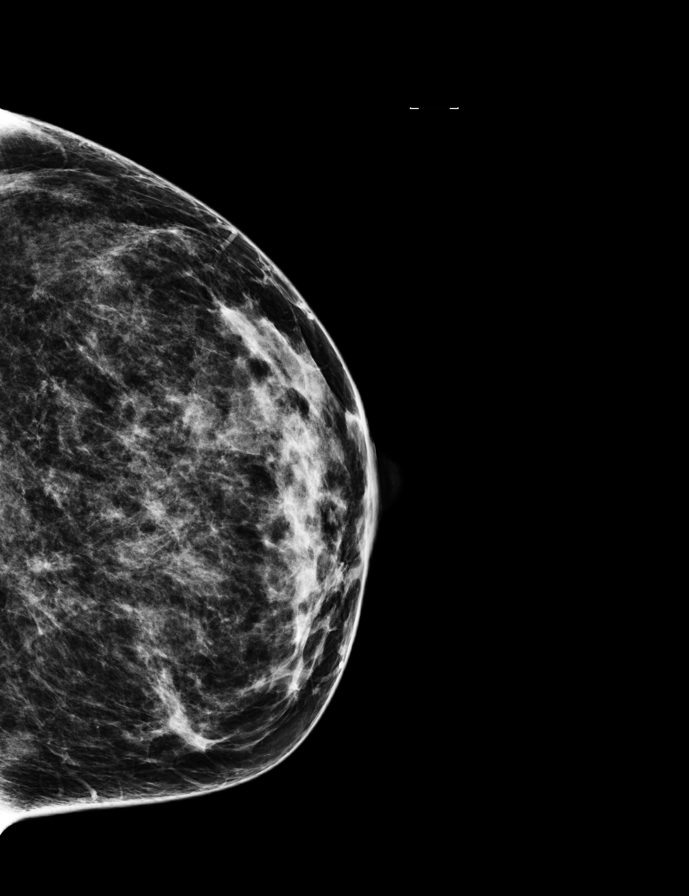

[R MLO]
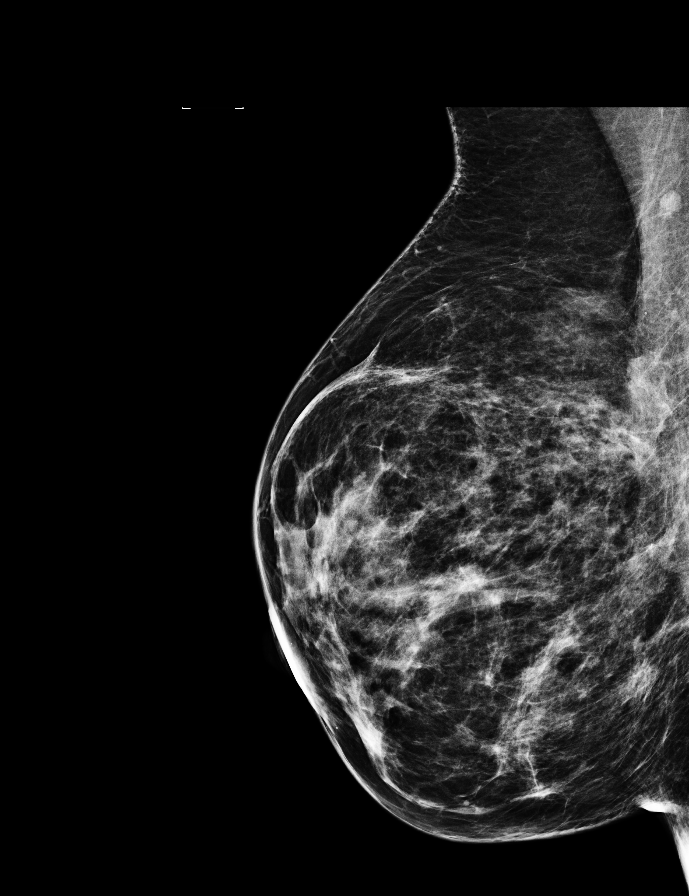

[R CC]
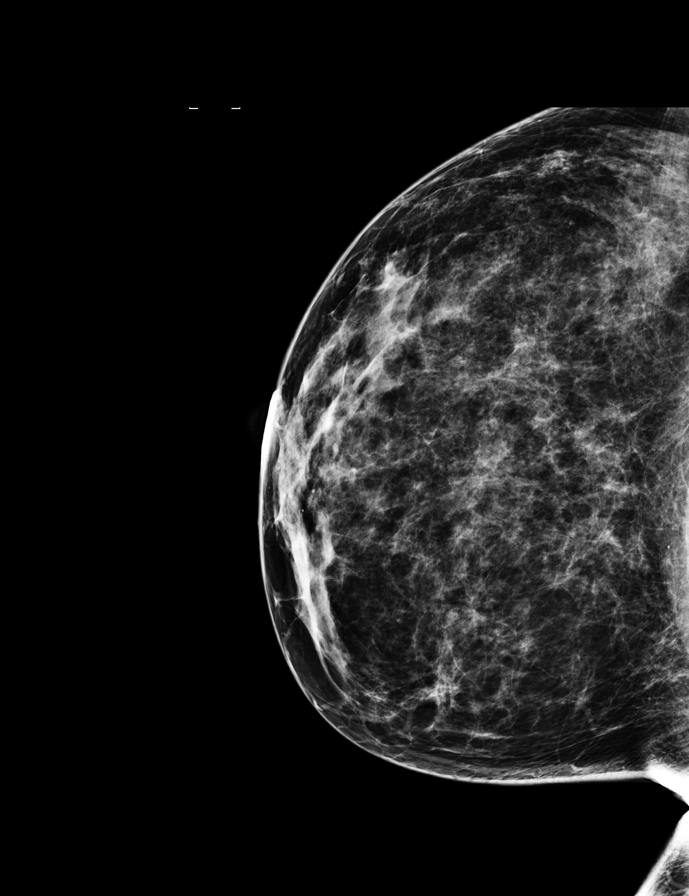

[R CC tomo · 2 of 71 frames shown]
[frame 23/71]
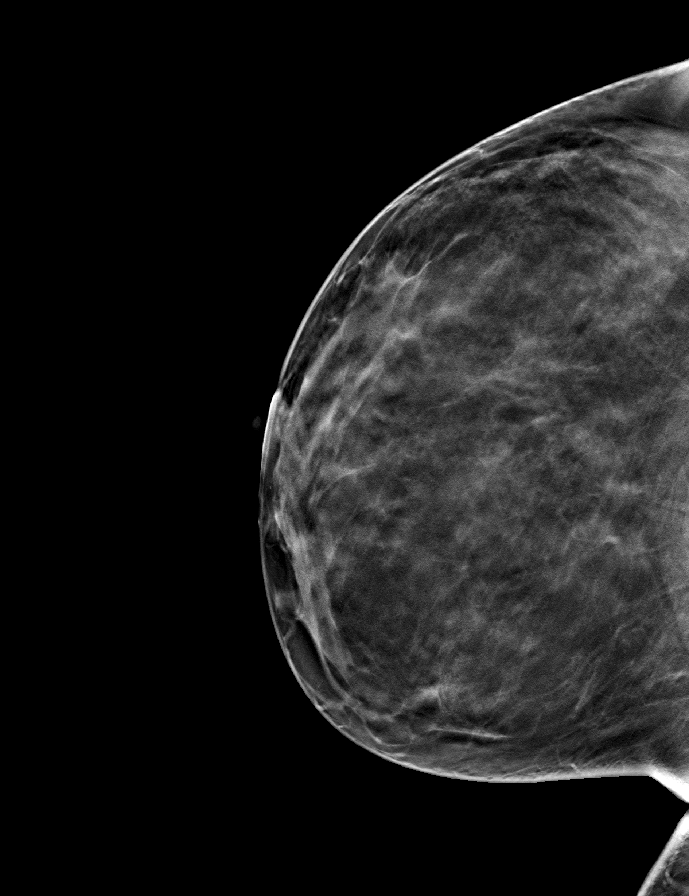
[frame 36/71]
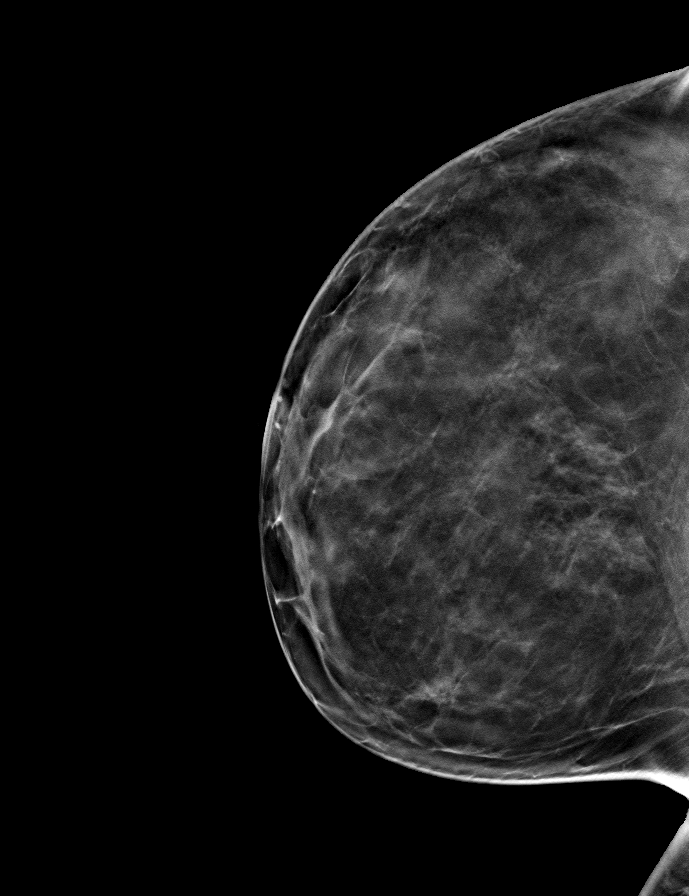

[L CC tomo · tomo slice 34/67.0]
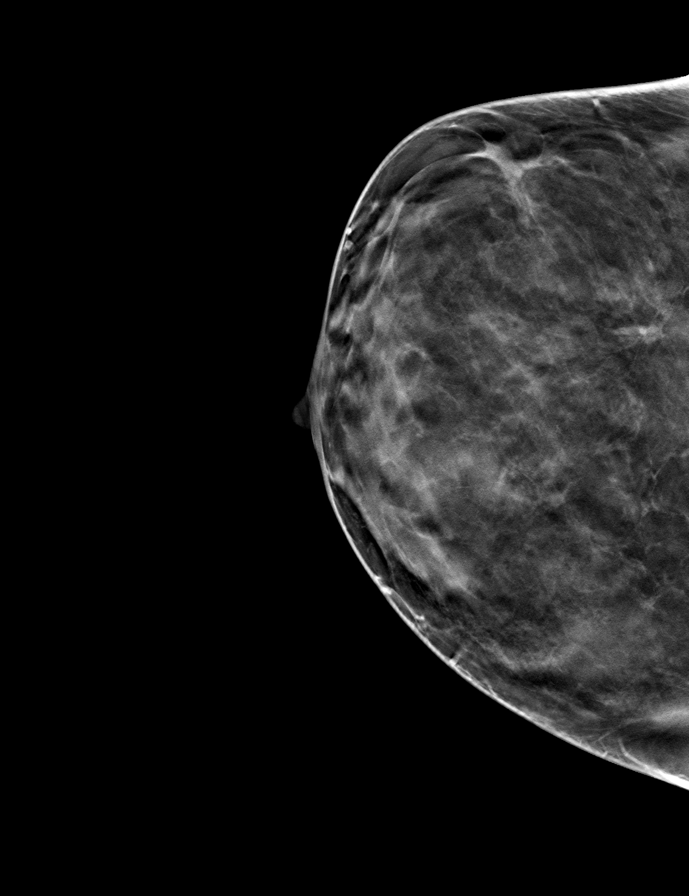

[R MLO tomo · tomo slice 40/79.0]
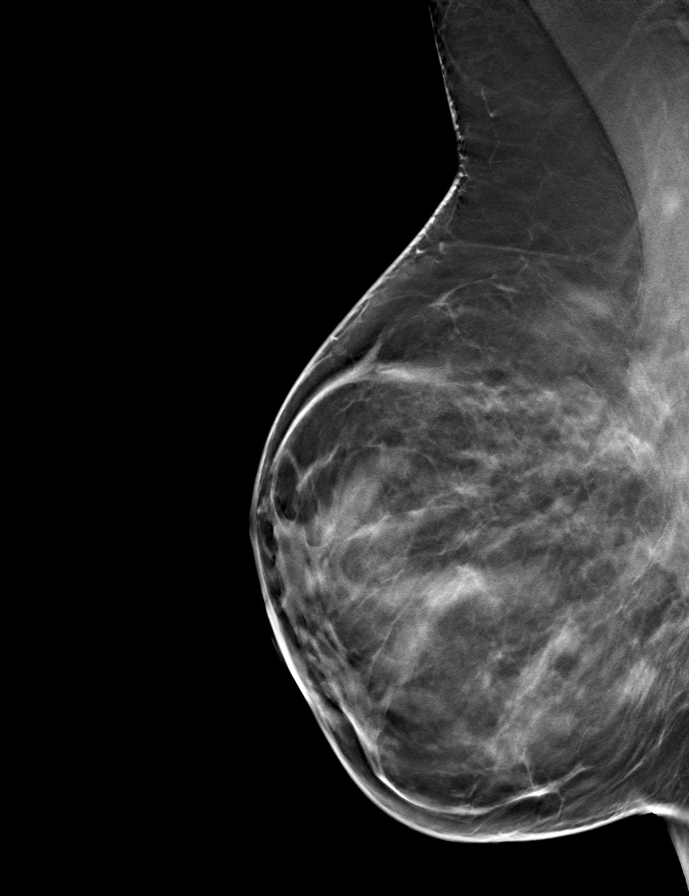

[L MLO tomo · tomo slice 37/73.0]
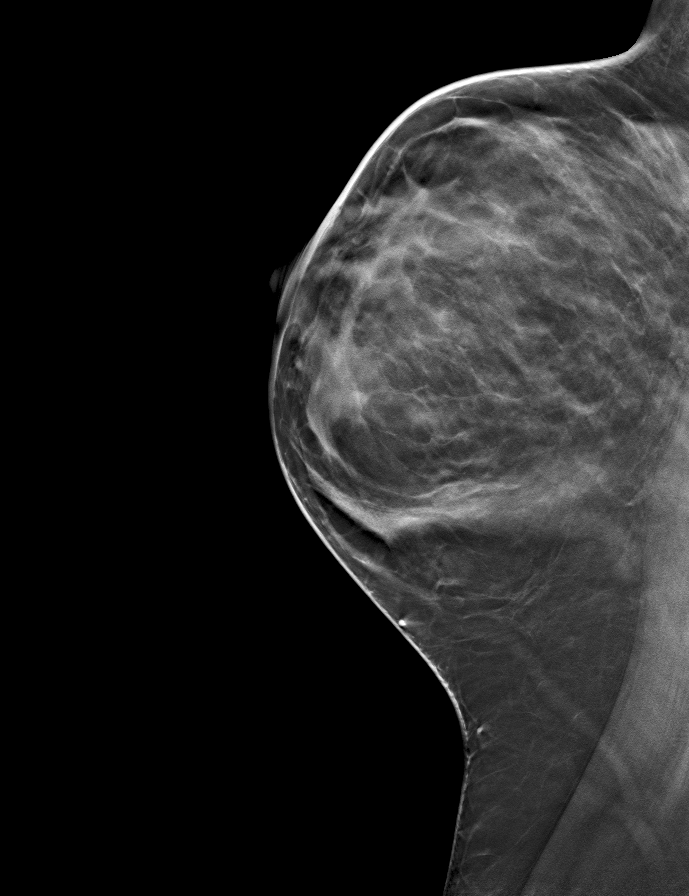

[9 of 24 positions shown; findings below may reference images not displayed]

FINDINGS: ACR Breast Density Category b: There are scattered areas of
fibroglandular density.

There is no suspicious dominant mass, architectural distortion, or
calcification to suggest malignancy.

Images were processed with CAD.
IMPRESSION: No mammographic evidence of malignancy.

A result letter of this screening mammogram will be mailed directly
to the patient.

RECOMMENDATION:
Screening mammogram in one year. (Code:4L-6-Z9Q)

BI-RADS CATEGORY 1:  Negative.

## 2014-06-18 ENCOUNTER — Other Ambulatory Visit: Payer: Self-pay | Admitting: Endocrinology

## 2014-09-22 ENCOUNTER — Other Ambulatory Visit: Payer: Self-pay | Admitting: Endocrinology

## 2014-12-16 ENCOUNTER — Other Ambulatory Visit: Payer: Self-pay

## 2014-12-16 ENCOUNTER — Telehealth: Payer: Self-pay | Admitting: Endocrinology

## 2014-12-16 MED ORDER — SYNTHROID 125 MCG PO TABS: ORAL_TABLET | ORAL | Status: DC

## 2014-12-18 NOTE — Telephone Encounter (Signed)
error 

## 2015-03-03 ENCOUNTER — Encounter: Payer: Self-pay | Admitting: Endocrinology

## 2015-03-03 ENCOUNTER — Ambulatory Visit (INDEPENDENT_AMBULATORY_CARE_PROVIDER_SITE_OTHER): Payer: BLUE CROSS/BLUE SHIELD | Admitting: Endocrinology

## 2015-03-03 VITALS — BP 124/70 | HR 89 | Temp 98.3°F | Ht 60.0 in | Wt 175.0 lb

## 2015-03-03 DIAGNOSIS — E890 Postprocedural hypothyroidism: Secondary | ICD-10-CM | POA: Insufficient documentation

## 2015-03-03 LAB — TSH: TSH: 6.36 u[IU]/mL — ABNORMAL HIGH (ref 0.35–4.50)

## 2015-03-03 MED ORDER — LEVOTHYROXINE SODIUM 137 MCG PO TABS
137.0000 ug | ORAL_TABLET | Freq: Every day | ORAL | Status: DC
Start: 2015-03-03 — End: 2015-03-30

## 2015-03-03 NOTE — Patient Instructions (Addendum)
A thyroid blood test is requested for you today.  We'll let you know about the results.  Please come back for a follow-up appointment in 1 year.   When you see the eye doctor tomorrow, please ask that a copy of the report be sent here.

## 2015-03-03 NOTE — Progress Notes (Signed)
Subjective:    Patient ID: Kristen Stuart, female    DOB: 06-14-1970, 45 y.o.   MRN: [id number]  HPI Pt returns for f/u of hyperthyroidism (dx'ed august of 2014, on a routine blood test; due to tachycardia, she had tapazole first; she then had i-131 rx in December of 2015).  She has regained her health insurance.  pt states she feels well in general, except for weight gain.   Past Medical History  Diagnosis Date  . Asthma   . Tobacco abuse   . Pericardial effusion   . Proptosis due to thyroid disorder     left eye  . Thyroid disease     hyperthyroidism    Past Surgical History  Procedure Laterality Date  . Tonsillectomy    . Breast lumpectomy Left 1999    L 12 o'clock, benign    Social History   Social History  . Marital Status: Married    Spouse Name: N/A  . Number of Children: N/A  . Years of Education: N/A   Occupational History  . Not on file.   Social History Main Topics  . Smoking status: Current Every Day Smoker -- 29 years    Types: Cigarettes  . Smokeless tobacco: Not on file     Comment: 10/02/13 currently 7 cigs or less a day  . Alcohol Use: Yes     Comment: rarely  . Drug Use: No  . Sexual Activity: Not on file   Other Topics Concern  . Not on file   Social History Narrative    Current Outpatient Prescriptions on File Prior to Visit  Medication Sig Dispense Refill  . albuterol (PROVENTIL HFA;VENTOLIN HFA) 108 (90 BASE) MCG/ACT inhaler Inhale 2 puffs into the lungs every 6 (six) hours as needed for wheezing or shortness of breath. 1 Inhaler 0   No current facility-administered medications on file prior to visit.    Allergies  Allergen Reactions  . Fish Allergy Anaphylaxis  . Shellfish Allergy Anaphylaxis  . Aspirin Other (See Comments)    REACTION: gi upset  . Cephalexin Other (See Comments)    "makes me crazy"    Family History  Problem Relation Age of Onset  . Diabetes Mother   . Hypertension Mother   . Hyperlipidemia Mother    . Depression Mother   . Cancer Father     Pancreatic     BP 124/70 mmHg  Pulse 89  Temp(Src) 98.3 F (36.8 C) (Oral)  Ht 5' (1.524 m)  Wt 175 lb (79.379 kg)  BMI 34.18 kg/m2  SpO2 98%  Review of Systems Denies palpitations.      Objective:   Physical Exam VITAL SIGNS:  See vs page GENERAL: no distress.  head: no deformity eyes: no periorbital swelling; there is bilat proptosis and disconjugate gaze.   external nose and ears are normal.   mouth: no lesion seen.   NECK: There is no palpable thyroid enlargement.  No thyroid nodule is palpable.  No palpable lymphadenopathy at the anterior neck.   Lab Results  Component Value Date   TSH 6.36* 03/03/2015      Assessment & Plan:  Hypothyroidism: she needs increased rx.    Patient is advised the following: Patient Instructions  A thyroid blood test is requested for you today.  We'll let you know about the results.  Please come back for a follow-up appointment in 1 year.   When you see the eye doctor tomorrow, please ask that a copy  of the report be sent here.

## 2015-03-30 ENCOUNTER — Other Ambulatory Visit: Payer: Self-pay | Admitting: Endocrinology

## 2015-03-30 ENCOUNTER — Other Ambulatory Visit (INDEPENDENT_AMBULATORY_CARE_PROVIDER_SITE_OTHER): Payer: BLUE CROSS/BLUE SHIELD

## 2015-03-30 DIAGNOSIS — E890 Postprocedural hypothyroidism: Secondary | ICD-10-CM | POA: Diagnosis not present

## 2015-03-30 LAB — TSH: TSH: 6.38 u[IU]/mL — ABNORMAL HIGH (ref 0.35–4.50)

## 2015-03-30 LAB — T4, FREE: Free T4: 1.16 ng/dL (ref 0.60–1.60)

## 2015-03-30 MED ORDER — LEVOTHYROXINE SODIUM 150 MCG PO TABS: 150.0000 ug | ORAL_TABLET | Freq: Every day | ORAL | Status: DC

## 2015-03-31 ENCOUNTER — Other Ambulatory Visit: Payer: Self-pay | Admitting: Obstetrics

## 2015-04-01 LAB — CYTOLOGY - PAP

## 2015-06-25 ENCOUNTER — Encounter: Payer: Self-pay | Admitting: Adult Health

## 2015-06-25 ENCOUNTER — Ambulatory Visit (INDEPENDENT_AMBULATORY_CARE_PROVIDER_SITE_OTHER): Payer: BLUE CROSS/BLUE SHIELD | Admitting: Adult Health

## 2015-06-25 DIAGNOSIS — J069 Acute upper respiratory infection, unspecified: Secondary | ICD-10-CM | POA: Diagnosis not present

## 2015-06-25 DIAGNOSIS — J4521 Mild intermittent asthma with (acute) exacerbation: Secondary | ICD-10-CM | POA: Diagnosis not present

## 2015-06-25 MED ORDER — DOXYCYCLINE HYCLATE 100 MG PO CAPS: 100.0000 mg | ORAL_CAPSULE | Freq: Two times a day (BID) | ORAL | Status: DC

## 2015-06-25 MED ORDER — IPRATROPIUM-ALBUTEROL 0.5-2.5 (3) MG/3ML IN SOLN: 3.0000 mL | Freq: Four times a day (QID) | RESPIRATORY_TRACT | Status: DC

## 2015-06-25 MED ORDER — PREDNISONE 10 MG PO TABS
ORAL_TABLET | ORAL | Status: DC
Start: 2015-06-25 — End: 2016-03-02

## 2015-06-25 MED ORDER — HYDROCODONE-HOMATROPINE 5-1.5 MG/5ML PO SYRP: 5.0000 mL | ORAL_SOLUTION | Freq: Three times a day (TID) | ORAL | Status: DC | PRN

## 2015-06-25 MED ORDER — ALBUTEROL SULFATE HFA 108 (90 BASE) MCG/ACT IN AERS: 2.0000 | INHALATION_SPRAY | Freq: Four times a day (QID) | RESPIRATORY_TRACT | Status: DC | PRN

## 2015-06-25 NOTE — Progress Notes (Signed)
Subjective:    Patient ID: Kristen Stuart, female    DOB: June 29, 1970, 45 y.o.   MRN: [id number]  URI  This is a new problem. The current episode started 1 to 4 weeks ago. The problem has been gradually worsening. There has been no fever. Associated symptoms include congestion, coughing and wheezing. Pertinent negatives include no headaches, nausea, plugged ear sensation, rhinorrhea or sore throat.    Review of Systems  Constitutional: Positive for chills and fatigue. Negative for fever, diaphoresis and activity change.  HENT: Positive for congestion and postnasal drip. Negative for rhinorrhea, sinus pressure and sore throat.   Eyes: Negative.   Respiratory: Positive for cough, shortness of breath and wheezing.   Cardiovascular: Negative.   Gastrointestinal: Negative for nausea.  Neurological: Negative for headaches.  Psychiatric/Behavioral: Negative.    Past Medical History  Diagnosis Date  . Asthma   . Tobacco abuse   . Pericardial effusion   . Proptosis due to thyroid disorder     left eye  . Thyroid disease     hyperthyroidism    Social History   Social History  . Marital Status: Married    Spouse Name: N/A  . Number of Children: N/A  . Years of Education: N/A   Occupational History  . Not on file.   Social History Main Topics  . Smoking status: Current Every Day Smoker -- 29 years    Types: Cigarettes  . Smokeless tobacco: Not on file     Comment: 10/02/13 currently 7 cigs or less a day  . Alcohol Use: Yes     Comment: rarely  . Drug Use: No  . Sexual Activity: Not on file   Other Topics Concern  . Not on file   Social History Narrative    Past Surgical History  Procedure Laterality Date  . Tonsillectomy    . Breast lumpectomy Left 1999    L 12 o'clock, benign    Family History  Problem Relation Age of Onset  . Diabetes Mother   . Hypertension Mother   . Hyperlipidemia Mother   . Depression Mother   . Cancer Father     Pancreatic      Allergies  Allergen Reactions  . Fish Allergy Anaphylaxis  . Shellfish Allergy Anaphylaxis  . Aspirin Other (See Comments)    REACTION: gi upset  . Cephalexin Other (See Comments)    "makes me crazy"    Current Outpatient Prescriptions on File Prior to Visit  Medication Sig Dispense Refill  . albuterol (PROVENTIL HFA;VENTOLIN HFA) 108 (90 BASE) MCG/ACT inhaler Inhale 2 puffs into the lungs every 6 (six) hours as needed for wheezing or shortness of breath. 1 Inhaler 0  . levothyroxine (SYNTHROID, LEVOTHROID) 150 MCG tablet Take 1 tablet (150 mcg total) by mouth daily before breakfast. 30 tablet 11   No current facility-administered medications on file prior to visit.    BP 130/82 mmHg  Pulse 84  Temp(Src) 98 F (36.7 C)  Wt 175 lb (79.379 kg)  SpO2 94%       Objective:   Physical Exam  Constitutional: She is oriented to person, place, and time. She appears well-developed and well-nourished. No distress.  Cardiovascular: Normal rate, regular rhythm, normal heart sounds and intact distal pulses.  Exam reveals no gallop and no friction rub.   No murmur heard. Pulmonary/Chest: Effort normal. She has wheezes in the right upper field, the right middle field, the right lower field, the left upper field, the  left middle field and the left lower field. She has rhonchi in the right upper field, the right middle field, the right lower field, the left upper field, the left middle field and the left lower field. She has no rales.  Abdominal: Soft. Bowel sounds are normal.  Musculoskeletal: Normal range of motion. She exhibits no edema or tenderness.  Neurological: She is alert and oriented to person, place, and time.  Skin: Skin is warm and dry. No rash noted. She is not diaphoretic. No erythema. No pallor.  Psychiatric: She has a normal mood and affect. Her behavior is normal. Judgment and thought content normal.  Nursing note and vitals reviewed.     Assessment & Plan:  1.  Intrinsic asthma, mild intermittent, with acute exacerbation - albuterol (PROVENTIL HFA;VENTOLIN HFA) 108 (90 Base) MCG/ACT inhaler; Inhale 2 puffs into the lungs every 6 (six) hours as needed for wheezing or shortness of breath.  Dispense: 1 Inhaler; Refill: 0 - predniSONE (DELTASONE) 10 MG tablet; 40 mg x 3 days, 20 mg x 3 days, 10 mg x 3 days  Dispense: 21 tablet; Refill: 0 - doxycycline (VIBRAMYCIN) 100 MG capsule; Take 1 capsule (100 mg total) by mouth 2 (two) times daily.  Dispense: 14 capsule; Refill: 0 - ipratropium-albuterol (DUONEB) 0.5-2.5 (3) MG/3ML nebulizer solution 3 mL; Take 3 mLs by nebulization every 6 (six) hours. - HYDROcodone-homatropine (HYCODAN) 5-1.5 MG/5ML syrup; Take 5 mLs by mouth every 8 (eight) hours as needed for cough.  Dispense: 120 mL; Refill: 0 - Patient endorsed being less SOB after duo neb. On exam rhonchi had cleared but still had trace wheezing.  - Follow up as needed  Shirline Freesory Agustus Mane, NP

## 2015-06-25 NOTE — Patient Instructions (Addendum)
It was great seeing you again today!  Your exam is consistent with a asthma exacerbation.   I have prescribed a new albuterol inhaler that you can use as needed. I have also sent in a prescription to the pharmacy for prednisone, take as directed.   Use the cough syrup at night as it will make you sleepy

## 2016-03-02 ENCOUNTER — Ambulatory Visit (INDEPENDENT_AMBULATORY_CARE_PROVIDER_SITE_OTHER): Payer: Self-pay | Admitting: Endocrinology

## 2016-03-02 ENCOUNTER — Encounter: Payer: Self-pay | Admitting: Endocrinology

## 2016-03-02 VITALS — BP 122/84 | HR 87 | Ht 60.0 in | Wt 178.0 lb

## 2016-03-02 DIAGNOSIS — E890 Postprocedural hypothyroidism: Secondary | ICD-10-CM

## 2016-03-02 LAB — T4, FREE: Free T4: 1.32 ng/dL (ref 0.60–1.60)

## 2016-03-02 LAB — TSH: TSH: 1.29 u[IU]/mL (ref 0.35–4.50)

## 2016-03-02 NOTE — Patient Instructions (Signed)
A thyroid blood test is requested for you today.  We'll let you know about the results.   Please come back for a follow-up appointment in 1 year.   

## 2016-03-02 NOTE — Progress Notes (Signed)
   Subjective:    Patient ID: Kristen Stuart, female    DOB: 1970-04-11, 46 y.o.   MRN: [id number]  HPI Pt returns for f/u of post-RAI hypothyroidism (dx'ed 2014; she had RAI in December of 2015; she has been on synthroid since 2016).  She reports fatigue.   Past Medical History:  Diagnosis Date  . Asthma   . Pericardial effusion   . Proptosis due to thyroid disorder    left eye  . Thyroid disease    hyperthyroidism  . Tobacco abuse     Past Surgical History:  Procedure Laterality Date  . BREAST LUMPECTOMY Left 1999   L 12 o'clock, benign  . TONSILLECTOMY      Social History   Social History  . Marital status: Married    Spouse name: N/A  . Number of children: N/A  . Years of education: N/A   Occupational History  . Not on file.   Social History Main Topics  . Smoking status: Never Smoker  . Smokeless tobacco: Never Used     Comment: 10/02/13 currently 7 cigs or less a day  . Alcohol use Yes     Comment: rarely  . Drug use: No  . Sexual activity: Not on file   Other Topics Concern  . Not on file   Social History Narrative  . No narrative on file    Current Outpatient Prescriptions on File Prior to Visit  Medication Sig Dispense Refill  . levothyroxine (SYNTHROID, LEVOTHROID) 150 MCG tablet Take 1 tablet (150 mcg total) by mouth daily before breakfast. 30 tablet 11   No current facility-administered medications on file prior to visit.     Allergies  Allergen Reactions  . Fish Allergy Anaphylaxis  . Shellfish Allergy Anaphylaxis  . Aspirin Other (See Comments)    REACTION: gi upset  . Cephalexin Other (See Comments)    "makes me crazy"    Family History  Problem Relation Age of Onset  . Diabetes Mother   . Hypertension Mother   . Hyperlipidemia Mother   . Depression Mother   . Cancer Father     Pancreatic     BP 122/84   Pulse 87   Ht 5' (1.524 m)   Wt 178 lb (80.7 kg)   SpO2 98%   BMI 34.76 kg/m    Review of Systems She has  dry skin.     Objective:   Physical Exam VITAL SIGNS:  See vs page GENERAL: no distress NECK: There is no palpable thyroid enlargement.  No thyroid nodule is palpable.  No palpable lymphadenopathy at the anterior neck.   Lab Results  Component Value Date   TSH 1.29 03/02/2016      Assessment & Plan:  Post-RAI hypothyroidism: well-replaced.  Please continue the same medication

## 2016-04-14 ENCOUNTER — Other Ambulatory Visit: Payer: Self-pay | Admitting: Endocrinology

## 2016-08-22 ENCOUNTER — Other Ambulatory Visit: Payer: Self-pay | Admitting: Adult Health

## 2016-08-22 DIAGNOSIS — J4521 Mild intermittent asthma with (acute) exacerbation: Secondary | ICD-10-CM

## 2016-09-22 ENCOUNTER — Encounter: Payer: Self-pay | Admitting: Family Medicine

## 2017-03-02 ENCOUNTER — Ambulatory Visit: Payer: Self-pay | Admitting: Endocrinology

## 2017-03-02 ENCOUNTER — Encounter: Payer: Self-pay | Admitting: Endocrinology

## 2017-03-02 VITALS — BP 128/80 | HR 87 | Ht 60.0 in | Wt 164.0 lb

## 2017-03-02 DIAGNOSIS — Z23 Encounter for immunization: Secondary | ICD-10-CM

## 2017-03-02 DIAGNOSIS — E890 Postprocedural hypothyroidism: Secondary | ICD-10-CM

## 2017-03-02 LAB — TSH: TSH: 0.22 u[IU]/mL — ABNORMAL LOW (ref 0.35–4.50)

## 2017-03-02 LAB — T4, FREE: Free T4: 1.2 ng/dL (ref 0.60–1.60)

## 2017-03-02 MED ORDER — LEVOTHYROXINE SODIUM 137 MCG PO TABS: 137.0000 ug | ORAL_TABLET | Freq: Every day | ORAL | 3 refills | Status: DC

## 2017-03-02 NOTE — Progress Notes (Signed)
   Subjective:    Patient ID: Kristen Stuart, female    DOB: [birth year], 47 y.o.   MRN: [id number]  HPI Pt returns for f/u of post-RAI hypothyroidism (dx'ed 2014; she had RAI in December of 2015, for Grave's Dz; she has been on synthroid since 2016).  She reports fatigue.  Past Medical History:  Diagnosis Date  . Asthma   . Pericardial effusion   . Proptosis due to thyroid disorder    left eye  . Thyroid disease    hyperthyroidism  . Tobacco abuse     Past Surgical History:  Procedure Laterality Date  . BREAST LUMPECTOMY Left 1999   L 12 o'clock, benign  . TONSILLECTOMY      Social History   Socioeconomic History  . Marital status: Married    Spouse name: Not on file  . Number of children: Not on file  . Years of education: Not on file  . Highest education level: Not on file  Social Needs  . Financial resource strain: Not on file  . Food insecurity - worry: Not on file  . Food insecurity - inability: Not on file  . Transportation needs - medical: Not on file  . Transportation needs - non-medical: Not on file  Occupational History  . Not on file  Tobacco Use  . Smoking status: Never Smoker  . Smokeless tobacco: Never Used  . Tobacco comment: 10/02/13 currently 7 cigs or less a day  Substance and Sexual Activity  . Alcohol use: Yes    Comment: rarely  . Drug use: No  . Sexual activity: Not on file  Other Topics Concern  . Not on file  Social History Narrative  . Not on file    No current outpatient medications on file prior to visit.   No current facility-administered medications on file prior to visit.     Allergies  Allergen Reactions  . Fish Allergy Anaphylaxis  . Shellfish Allergy Anaphylaxis  . Aspirin Other (See Comments)    REACTION: gi upset  . Cephalexin Other (See Comments)    "makes me crazy"    Family History  Problem Relation Age of Onset  . Diabetes Mother   . Hypertension Mother   . Hyperlipidemia Mother   . Depression Mother    . Cancer Father        Pancreatic     BP 128/80 (BP Location: Left Arm, Patient Position: Sitting, Cuff Size: Normal)   Pulse 87   Ht 5' (1.524 m)   Wt 164 lb (74.4 kg)   SpO2 98%   BMI 32.03 kg/m    Review of Systems Denies leg edema    Objective:   Physical Exam VITAL SIGNS:  See vs page GENERAL: no distress NECK: There is no palpable thyroid enlargement.  No thyroid nodule is palpable.  No palpable lymphadenopathy at the anterior neck.        Assessment & Plan:  Hypothyroidism: due for recheck  Patient Instructions  A thyroid blood test is requested for you today.  We'll let you know about the results.   Please come back for a follow-up appointment in 1 year.

## 2017-03-02 NOTE — Patient Instructions (Signed)
A thyroid blood test is requested for you today.  We'll let you know about the results.   Please come back for a follow-up appointment in 1 year.   

## 2017-05-15 ENCOUNTER — Other Ambulatory Visit: Payer: Self-pay

## 2017-05-15 MED ORDER — LEVOTHYROXINE SODIUM 137 MCG PO TABS: 137.0000 ug | ORAL_TABLET | Freq: Every day | ORAL | 3 refills | Status: DC

## 2017-05-22 ENCOUNTER — Other Ambulatory Visit: Payer: Self-pay

## 2017-05-22 ENCOUNTER — Telehealth: Payer: Self-pay | Admitting: Endocrinology

## 2017-05-22 NOTE — Telephone Encounter (Signed)
137 mcg/d

## 2017-05-22 NOTE — Telephone Encounter (Signed)
-----   Message from Davis Gourd, CMA sent at 05/22/2017 10:43 AM EDT ----- Regarding: synthroid dose Should patient be on 137 mcg or 150 mcg? It appears 137, but I keep getting refills requests for the 150?

## 2018-03-04 ENCOUNTER — Ambulatory Visit: Payer: Self-pay | Admitting: Endocrinology

## 2018-03-12 ENCOUNTER — Other Ambulatory Visit: Payer: Self-pay | Admitting: Endocrinology

## 2018-06-05 ENCOUNTER — Other Ambulatory Visit: Payer: Self-pay | Admitting: Endocrinology

## 2018-06-05 NOTE — Telephone Encounter (Signed)
Please refill x 1 F/u is due.  Please encourage f/u, as this is inexpensive

## 2018-06-05 NOTE — Telephone Encounter (Signed)
LOV 03/02/17. You asked that she f/u 1 year. Called pt to schedule f/u appt. States she no longer has insurance and cannot schedule appt. Please advise if refill is appropriate.

## 2018-07-15 ENCOUNTER — Other Ambulatory Visit: Payer: Self-pay | Admitting: Endocrinology

## 2018-07-15 NOTE — Telephone Encounter (Signed)
Please refill x 1 F/u is due  

## 2018-08-10 ENCOUNTER — Other Ambulatory Visit: Payer: Self-pay | Admitting: Endocrinology

## 2018-08-10 NOTE — Telephone Encounter (Signed)
Please refill x 1 F/u is due  

## 2018-09-10 ENCOUNTER — Other Ambulatory Visit: Payer: Self-pay | Admitting: Endocrinology

## 2018-09-10 NOTE — Telephone Encounter (Signed)
Last office visit March 2019  Cancel/No-show? none  Future office visit scheduled? No  Please advise on refill

## 2018-09-10 NOTE — Telephone Encounter (Signed)
Please refill x 1 F/u is due  

## 2018-09-11 NOTE — Telephone Encounter (Signed)
Please call pt and schedule f/u and route back so refill can be sent.

## 2018-09-11 NOTE — Telephone Encounter (Signed)
Called and spoke with patient and she informed she has been unable to schedule a follow-up due to losing her insurance and job because of [place]. I directed the patient to the [facility] Patient Assistance.

## 2018-09-13 ENCOUNTER — Encounter: Payer: Self-pay | Admitting: Endocrinology

## 2018-09-13 ENCOUNTER — Ambulatory Visit (INDEPENDENT_AMBULATORY_CARE_PROVIDER_SITE_OTHER): Payer: Self-pay | Admitting: Endocrinology

## 2018-09-13 ENCOUNTER — Other Ambulatory Visit: Payer: Self-pay

## 2018-09-13 VITALS — BP 124/60 | HR 91 | Ht 60.0 in | Wt 170.4 lb

## 2018-09-13 DIAGNOSIS — Z23 Encounter for immunization: Secondary | ICD-10-CM

## 2018-09-13 DIAGNOSIS — E890 Postprocedural hypothyroidism: Secondary | ICD-10-CM

## 2018-09-13 LAB — TSH: TSH: 0.56 u[IU]/mL (ref 0.35–4.50)

## 2018-09-13 MED ORDER — SYNTHROID 137 MCG PO TABS: 137.0000 ug | ORAL_TABLET | Freq: Every day | ORAL | 3 refills | Status: DC

## 2018-09-13 NOTE — Progress Notes (Signed)
Subjective:    Patient ID: Kristen Stuart, female    DOB: [birth year], 48 y.o.   MRN: [id number]  HPI Pt returns for f/u of post-RAI hypothyroidism (dx'ed 2014; she had RAI in December of 2015, for Grave's Dz; she has been on synthroid since 2016).  She again reports fatigue and difficulty losing weight.   Past Medical History:  Diagnosis Date  . Asthma   . Pericardial effusion   . Proptosis due to thyroid disorder    left eye  . Thyroid disease    hyperthyroidism  . Tobacco abuse     Past Surgical History:  Procedure Laterality Date  . BREAST LUMPECTOMY Left 1999   L 12 o'clock, benign  . TONSILLECTOMY      Social History   Socioeconomic History  . Marital status: Married    Spouse name: Not on file  . Number of children: Not on file  . Years of education: Not on file  . Highest education level: Not on file  Occupational History  . Not on file  Social Needs  . Financial resource strain: Not on file  . Food insecurity    Worry: Not on file    Inability: Not on file  . Transportation needs    Medical: Not on file    Non-medical: Not on file  Tobacco Use  . Smoking status: Never Smoker  . Smokeless tobacco: Never Used  . Tobacco comment: 10/02/13 currently 7 cigs or less a day  Substance and Sexual Activity  . Alcohol use: Yes    Comment: rarely  . Drug use: No  . Sexual activity: Not on file  Lifestyle  . Physical activity    Days per week: Not on file    Minutes per session: Not on file  . Stress: Not on file  Relationships  . Social Herbalist on phone: Not on file    Gets together: Not on file    Attends religious service: Not on file    Active member of club or organization: Not on file    Attends meetings of clubs or organizations: Not on file    Relationship status: Not on file  . Intimate partner violence    Fear of current or ex partner: Not on file    Emotionally abused: Not on file    Physically abused: Not on file    Forced  sexual activity: Not on file  Other Topics Concern  . Not on file  Social History Narrative  . Not on file    No current outpatient medications on file prior to visit.   No current facility-administered medications on file prior to visit.     Allergies  Allergen Reactions  . Fish Allergy Anaphylaxis  . Shellfish Allergy Anaphylaxis  . Aspirin Other (See Comments)    REACTION: gi upset  . Cephalexin Other (See Comments)    "makes me crazy"    Family History  Problem Relation Age of Onset  . Diabetes Mother   . Hypertension Mother   . Hyperlipidemia Mother   . Depression Mother   . Cancer Father        Pancreatic     BP 124/60 (BP Location: Left Arm, Patient Position: Sitting, Cuff Size: Normal)   Pulse 91   Ht 5' (1.524 m)   Wt 170 lb 6.4 oz (77.3 kg)   SpO2 95%   BMI 33.28 kg/m    Review of Systems Denies neck swelling.  Objective:   Physical Exam VITAL SIGNS:  See vs page GENERAL: no distress NECK: There is no palpable thyroid enlargement.  No thyroid nodule is palpable.  No palpable lymphadenopathy at the anterior neck.  Lab Results  Component Value Date   TSH 0.56 09/13/2018      Assessment & Plan:  Hypothyroidism: well-replaced.  Please continue the same medications Difficulty losing weight.  Not thyroid-related Please come back for a follow-up appointment in 1 year.

## 2018-09-13 NOTE — Patient Instructions (Signed)
Blood tests are requested for you today.  We'll let you know about the results.  Please come back for a follow-up appointment in 1 year.    

## 2018-09-16 NOTE — Telephone Encounter (Signed)
Please advise 

## 2019-03-02 ENCOUNTER — Other Ambulatory Visit: Payer: Self-pay | Admitting: Endocrinology

## 2019-05-07 ENCOUNTER — Other Ambulatory Visit: Payer: Self-pay

## 2019-05-07 DIAGNOSIS — E890 Postprocedural hypothyroidism: Secondary | ICD-10-CM

## 2019-05-07 MED ORDER — LEVOTHYROXINE SODIUM 137 MCG PO TABS: ORAL_TABLET | ORAL | 3 refills | Status: DC

## 2019-09-12 ENCOUNTER — Ambulatory Visit: Payer: Self-pay | Admitting: Endocrinology

## 2019-10-17 ENCOUNTER — Encounter: Payer: Self-pay | Admitting: Endocrinology

## 2019-10-17 ENCOUNTER — Other Ambulatory Visit: Payer: Self-pay

## 2019-10-17 ENCOUNTER — Ambulatory Visit: Payer: Self-pay | Admitting: Endocrinology

## 2019-10-17 VITALS — BP 110/78 | HR 97 | Ht 62.25 in | Wt 147.0 lb

## 2019-10-17 DIAGNOSIS — Z23 Encounter for immunization: Secondary | ICD-10-CM

## 2019-10-17 DIAGNOSIS — E890 Postprocedural hypothyroidism: Secondary | ICD-10-CM

## 2019-10-17 LAB — T4, FREE: Free T4: 1.39 ng/dL (ref 0.60–1.60)

## 2019-10-17 LAB — TSH: TSH: 0.11 u[IU]/mL — ABNORMAL LOW (ref 0.35–4.50)

## 2019-10-17 MED ORDER — LEVOTHYROXINE SODIUM 125 MCG PO TABS: 125.0000 ug | ORAL_TABLET | Freq: Every day | ORAL | 3 refills | Status: DC

## 2019-10-17 NOTE — Patient Instructions (Addendum)
Blood tests are requested for you today.  We'll let you know about the results.  It is best to never miss the medication.  However, if you do miss it, next best is to double up the next time.  Please come back for a follow-up appointment in 1 year.   

## 2019-10-17 NOTE — Progress Notes (Signed)
Subjective:    Patient ID: Kristen Stuart, female    DOB: 1970/08/02, 49 y.o.   MRN: [id number]  HPI Pt returns for f/u of post-RAI hypothyroidism (dx'ed 2014; she had RAI in December of 2015, for Grave's Dz; she has been on synthroid since 2016).  She takes synthroid as rx'ed.  pt states she feels well in general.   Past Medical History:  Diagnosis Date  . Asthma   . Pericardial effusion   . Proptosis due to thyroid disorder    left eye  . Thyroid disease    hyperthyroidism  . Tobacco abuse     Past Surgical History:  Procedure Laterality Date  . BREAST LUMPECTOMY Left 1999   L 12 o'clock, benign  . TONSILLECTOMY      Social History   Socioeconomic History  . Marital status: Married    Spouse name: Not on file  . Number of children: Not on file  . Years of education: Not on file  . Highest education level: Not on file  Occupational History  . Not on file  Tobacco Use  . Smoking status: Never Smoker  . Smokeless tobacco: Never Used  . Tobacco comment: 10/02/13 currently 7 cigs or less a day  Substance and Sexual Activity  . Alcohol use: Yes    Comment: rarely  . Drug use: No  . Sexual activity: Not on file  Other Topics Concern  . Not on file  Social History Narrative  . Not on file   Social Determinants of Health   Financial Resource Strain:   . Difficulty of Paying Living Expenses: Not on file  Food Insecurity:   . Worried About Programme researcher, broadcasting/film/video in the Last Year: Not on file  . Ran Out of Food in the Last Year: Not on file  Transportation Needs:   . Lack of Transportation (Medical): Not on file  . Lack of Transportation (Non-Medical): Not on file  Physical Activity:   . Days of Exercise per Week: Not on file  . Minutes of Exercise per Session: Not on file  Stress:   . Feeling of Stress : Not on file  Social Connections:   . Frequency of Communication with Friends and Family: Not on file  . Frequency of Social Gatherings with Friends and  Family: Not on file  . Attends Religious Services: Not on file  . Active Member of Clubs or Organizations: Not on file  . Attends Banker Meetings: Not on file  . Marital Status: Not on file  Intimate Partner Violence:   . Fear of Current or Ex-Partner: Not on file  . Emotionally Abused: Not on file  . Physically Abused: Not on file  . Sexually Abused: Not on file    Current Outpatient Medications on File Prior to Visit  Medication Sig Dispense Refill  . Multiple Vitamin (MULTIVITAMIN) tablet Take 1 tablet by mouth daily.     No current facility-administered medications on file prior to visit.    Allergies  Allergen Reactions  . Fish Allergy Anaphylaxis  . Shellfish Allergy Anaphylaxis  . Aspirin Other (See Comments)    REACTION: gi upset  . Cephalexin Other (See Comments)    "makes me crazy"    Family History  Problem Relation Age of Onset  . Diabetes Mother   . Hypertension Mother   . Hyperlipidemia Mother   . Depression Mother   . Cancer Father        Pancreatic  BP 110/78   Pulse 97   Ht 5' 2.25" (1.581 m)   Wt 147 lb (66.7 kg)   SpO2 98%   BMI 26.67 kg/m     Review of Systems She has lost weight--intentional.      Objective:   Physical Exam VITAL SIGNS:  See vs page.   GENERAL: no distress.  NECK: There is no palpable thyroid enlargement.  No thyroid nodule is palpable.  No palpable lymphadenopathy at the anterior neck.   Lab Results  Component Value Date   TSH 0.11 (L) 10/17/2019      Assessment & Plan:  Hypothyroidism: overreplaced.  I have sent a prescription to your pharmacy, to reduce synthroid

## 2020-08-03 ENCOUNTER — Encounter: Payer: Self-pay | Admitting: Endocrinology

## 2020-08-04 ENCOUNTER — Other Ambulatory Visit: Payer: Self-pay | Admitting: Endocrinology

## 2020-08-04 DIAGNOSIS — E890 Postprocedural hypothyroidism: Secondary | ICD-10-CM

## 2020-08-04 NOTE — Telephone Encounter (Signed)
Message sent thru MyChart 

## 2020-08-19 ENCOUNTER — Other Ambulatory Visit (INDEPENDENT_AMBULATORY_CARE_PROVIDER_SITE_OTHER): Payer: Self-pay

## 2020-08-19 ENCOUNTER — Other Ambulatory Visit: Payer: Self-pay

## 2020-08-19 DIAGNOSIS — E890 Postprocedural hypothyroidism: Secondary | ICD-10-CM

## 2020-08-19 LAB — TSH: TSH: 1.89 u[IU]/mL (ref 0.35–5.50)

## 2020-08-19 LAB — T4, FREE: Free T4: 1.29 ng/dL (ref 0.60–1.60)

## 2020-09-25 ENCOUNTER — Other Ambulatory Visit: Payer: Self-pay | Admitting: Endocrinology

## 2020-10-15 ENCOUNTER — Ambulatory Visit: Payer: BC Managed Care – PPO | Admitting: Endocrinology

## 2020-10-15 ENCOUNTER — Other Ambulatory Visit: Payer: Self-pay

## 2020-10-15 VITALS — BP 132/78 | HR 85 | Ht 62.25 in | Wt 149.0 lb

## 2020-10-15 DIAGNOSIS — E890 Postprocedural hypothyroidism: Secondary | ICD-10-CM

## 2020-10-15 DIAGNOSIS — Z23 Encounter for immunization: Secondary | ICD-10-CM

## 2020-10-15 LAB — TSH: TSH: 0.5 u[IU]/mL (ref 0.35–5.50)

## 2020-10-15 LAB — T4, FREE: Free T4: 1.3 ng/dL (ref 0.60–1.60)

## 2020-10-15 NOTE — Progress Notes (Signed)
   Subjective:    Patient ID: Kristen Stuart, female    DOB: May 05, 1970, 50 y.o.   MRN: [id number]  HPI Pt returns for f/u of post-RAI hypothyroidism (dx'ed 2014; she had RAI in December of 2015, for Grave's Dz; she has been on synthroid since 2016).  She takes synthroid as rx'ed.  She reports fatigue and weight gain.   Past Medical History:  Diagnosis Date   Asthma    Pericardial effusion    Proptosis due to thyroid disorder    left eye   Thyroid disease    hyperthyroidism   Tobacco abuse     Past Surgical History:  Procedure Laterality Date   BREAST LUMPECTOMY Left 1999   L 12 o'clock, benign   TONSILLECTOMY      Social History   Socioeconomic History   Marital status: Married    Spouse name: Not on file   Number of children: Not on file   Years of education: Not on file   Highest education level: Not on file  Occupational History   Not on file  Tobacco Use   Smoking status: Never   Smokeless tobacco: Never   Tobacco comments:    10/02/13 currently 7 cigs or less a day  Substance and Sexual Activity   Alcohol use: Yes    Comment: rarely   Drug use: No   Sexual activity: Not on file  Other Topics Concern   Not on file  Social History Narrative   Not on file   Social Determinants of Health   Financial Resource Strain: Not on file  Food Insecurity: Not on file  Transportation Needs: Not on file  Physical Activity: Not on file  Stress: Not on file  Social Connections: Not on file  Intimate Partner Violence: Not on file    Current Outpatient Medications on File Prior to Visit  Medication Sig Dispense Refill   Multiple Vitamin (MULTIVITAMIN) tablet Take 1 tablet by mouth daily.     SYNTHROID 125 MCG tablet Take 1 tablet by mouth once daily before breakfast. 90 tablet 0   No current facility-administered medications on file prior to visit.    Allergies  Allergen Reactions   Fish Allergy Anaphylaxis   Shellfish Allergy Anaphylaxis   Aspirin Other  (See Comments)    REACTION: gi upset   Cephalexin Other (See Comments)    "makes me crazy"    Family History  Problem Relation Age of Onset   Diabetes Mother    Hypertension Mother    Hyperlipidemia Mother    Depression Mother    Cancer Father        Pancreatic     BP 132/78 (BP Location: Right Arm, Patient Position: Sitting, Cuff Size: Normal)   Pulse 85   Ht 5' 2.25" (1.581 m)   Wt 149 lb (67.6 kg)   SpO2 95%   BMI 27.03 kg/m    Review of Systems     Objective:   Physical Exam VITAL SIGNS:  See vs page GENERAL: no distress NECK: There is no palpable thyroid enlargement.  No thyroid nodule is palpable.  No palpable lymphadenopathy at the anterior neck.     Lab Results  Component Value Date   TSH 0.50 10/15/2020      Assessment & Plan:  Hypothyroidism: well-controlled.  Please continue the same synthroid.

## 2020-10-15 NOTE — Patient Instructions (Addendum)
Blood tests are requested for you today.  We'll let you know about the results.  It is best to never miss the medication.  However, if you do miss it, next best is to double up the next time.  Please come back for a follow-up appointment in 1 year.   

## 2020-10-27 DIAGNOSIS — Z7689 Persons encountering health services in other specified circumstances: Secondary | ICD-10-CM | POA: Diagnosis not present

## 2020-10-27 DIAGNOSIS — E039 Hypothyroidism, unspecified: Secondary | ICD-10-CM | POA: Diagnosis not present

## 2020-10-27 DIAGNOSIS — Z1211 Encounter for screening for malignant neoplasm of colon: Secondary | ICD-10-CM | POA: Diagnosis not present

## 2020-11-08 DIAGNOSIS — R591 Generalized enlarged lymph nodes: Secondary | ICD-10-CM | POA: Diagnosis not present

## 2020-11-08 DIAGNOSIS — J029 Acute pharyngitis, unspecified: Secondary | ICD-10-CM | POA: Diagnosis not present

## 2020-11-08 DIAGNOSIS — B349 Viral infection, unspecified: Secondary | ICD-10-CM | POA: Diagnosis not present

## 2020-11-08 DIAGNOSIS — R0982 Postnasal drip: Secondary | ICD-10-CM | POA: Diagnosis not present

## 2020-11-08 DIAGNOSIS — R0981 Nasal congestion: Secondary | ICD-10-CM | POA: Diagnosis not present

## 2020-12-20 ENCOUNTER — Other Ambulatory Visit: Payer: Self-pay | Admitting: Endocrinology

## 2020-12-30 DIAGNOSIS — Z0000 Encounter for general adult medical examination without abnormal findings: Secondary | ICD-10-CM | POA: Diagnosis not present

## 2021-01-10 DIAGNOSIS — Z1211 Encounter for screening for malignant neoplasm of colon: Secondary | ICD-10-CM | POA: Diagnosis not present

## 2021-01-10 DIAGNOSIS — Z0000 Encounter for general adult medical examination without abnormal findings: Secondary | ICD-10-CM | POA: Diagnosis not present

## 2021-01-10 DIAGNOSIS — Z23 Encounter for immunization: Secondary | ICD-10-CM | POA: Diagnosis not present

## 2021-01-31 DIAGNOSIS — H04123 Dry eye syndrome of bilateral lacrimal glands: Secondary | ICD-10-CM | POA: Diagnosis not present

## 2021-02-07 DIAGNOSIS — Z23 Encounter for immunization: Secondary | ICD-10-CM | POA: Diagnosis not present

## 2021-03-11 DIAGNOSIS — L821 Other seborrheic keratosis: Secondary | ICD-10-CM | POA: Diagnosis not present

## 2021-03-21 ENCOUNTER — Other Ambulatory Visit: Payer: Self-pay | Admitting: Endocrinology

## 2021-04-19 DIAGNOSIS — E039 Hypothyroidism, unspecified: Secondary | ICD-10-CM | POA: Diagnosis not present

## 2021-04-19 DIAGNOSIS — E782 Mixed hyperlipidemia: Secondary | ICD-10-CM | POA: Diagnosis not present

## 2021-04-21 DIAGNOSIS — Z720 Tobacco use: Secondary | ICD-10-CM | POA: Diagnosis not present

## 2021-04-21 DIAGNOSIS — K219 Gastro-esophageal reflux disease without esophagitis: Secondary | ICD-10-CM | POA: Diagnosis not present

## 2021-04-21 DIAGNOSIS — E782 Mixed hyperlipidemia: Secondary | ICD-10-CM | POA: Diagnosis not present

## 2021-04-21 DIAGNOSIS — E039 Hypothyroidism, unspecified: Secondary | ICD-10-CM | POA: Diagnosis not present

## 2021-04-21 DIAGNOSIS — K2100 Gastro-esophageal reflux disease with esophagitis, without bleeding: Secondary | ICD-10-CM | POA: Diagnosis not present

## 2021-04-26 ENCOUNTER — Encounter (HOSPITAL_COMMUNITY): Payer: Self-pay | Admitting: Emergency Medicine

## 2021-04-26 ENCOUNTER — Emergency Department (HOSPITAL_COMMUNITY): Payer: BC Managed Care – PPO

## 2021-04-26 ENCOUNTER — Emergency Department (HOSPITAL_COMMUNITY)
Admission: EM | Admit: 2021-04-26 | Discharge: 2021-04-26 | Disposition: A | Discharge status: Home / Self Care | Payer: BC Managed Care – PPO | Attending: Emergency Medicine | Admitting: Emergency Medicine

## 2021-04-26 ENCOUNTER — Ambulatory Visit (INDEPENDENT_AMBULATORY_CARE_PROVIDER_SITE_OTHER): Payer: BC Managed Care – PPO | Admitting: Endocrinology

## 2021-04-26 ENCOUNTER — Other Ambulatory Visit: Payer: Self-pay

## 2021-04-26 VITALS — BP 124/86 | HR 99 | Ht 62.0 in | Wt 156.2 lb

## 2021-04-26 DIAGNOSIS — Z79899 Other long term (current) drug therapy: Secondary | ICD-10-CM | POA: Diagnosis not present

## 2021-04-26 DIAGNOSIS — R0789 Other chest pain: Secondary | ICD-10-CM | POA: Insufficient documentation

## 2021-04-26 DIAGNOSIS — R079 Chest pain, unspecified: Secondary | ICD-10-CM

## 2021-04-26 DIAGNOSIS — E890 Postprocedural hypothyroidism: Secondary | ICD-10-CM | POA: Diagnosis not present

## 2021-04-26 DIAGNOSIS — R202 Paresthesia of skin: Secondary | ICD-10-CM | POA: Diagnosis not present

## 2021-04-26 DIAGNOSIS — E039 Hypothyroidism, unspecified: Secondary | ICD-10-CM | POA: Insufficient documentation

## 2021-04-26 LAB — BASIC METABOLIC PANEL
Anion gap: 10 (ref 5–15)
BUN: 10 mg/dL (ref 6–20)
CO2: 25 mmol/L (ref 22–32)
Calcium: 9.9 mg/dL (ref 8.9–10.3)
Chloride: 104 mmol/L (ref 98–111)
Creatinine, Ser: 0.72 mg/dL (ref 0.44–1.00)
GFR, Estimated: >60 mL/min (ref >60)
Glucose, Bld: 115 mg/dL — ABNORMAL HIGH (ref 70–99)
Potassium: 5.2 mmol/L — ABNORMAL HIGH (ref 3.5–5.1)
Sodium: 139 mmol/L (ref 135–145)

## 2021-04-26 LAB — CBC
HCT: 48.5 % — ABNORMAL HIGH (ref 36.0–46.0)
Hemoglobin: 16.4 g/dL — ABNORMAL HIGH (ref 12.0–15.0)
MCH: 31.4 pg (ref 26.0–34.0)
MCHC: 33.8 g/dL (ref 30.0–36.0)
MCV: 92.9 fL (ref 80.0–100.0)
Platelets: 300 10*3/uL (ref 150–400)
RBC: 5.22 MIL/uL — ABNORMAL HIGH (ref 3.87–5.11)
RDW: 13.5 % (ref 11.5–15.5)
WBC: 7.2 10*3/uL (ref 4.0–10.5)
nRBC: 0 % (ref 0.0–0.2)

## 2021-04-26 LAB — TROPONIN I (HIGH SENSITIVITY)
Troponin I (High Sensitivity): <2 ng/L (ref <18)
Troponin I (High Sensitivity): <2 ng/L (ref <18)

## 2021-04-26 LAB — TSH: TSH: 0.74 u[IU]/mL (ref 0.35–5.50)

## 2021-04-26 LAB — T3, FREE: T3, Free: 3.2 pg/mL (ref 2.3–4.2)

## 2021-04-26 LAB — T4, FREE: Free T4: 1.38 ng/dL (ref 0.60–1.60)

## 2021-04-26 MED ORDER — SYNTHROID 125 MCG PO TABS: 125.0000 ug | ORAL_TABLET | Freq: Every day | ORAL | 1 refills | Status: DC

## 2021-04-26 MED ORDER — LACTATED RINGERS IV BOLUS
1000.0000 mL | Freq: Once | INTRAVENOUS | Status: AC
  Administered 2021-04-26: 1000 mL via INTRAVENOUS

## 2021-04-26 NOTE — ED Provider Notes (Signed)
?[facility] EMERGENCY DEPARTMENT ?Provider Note ? ? ?CSN: [id number] ?Arrival date & time: 04/26/21  0911 ? ?  ? ?History ? ?Chief Complaint  ?Patient presents with  ? Chest Pain  ? ? ?Kristen Stuart is a 51 y.o. female with history of hypothyroidism secondary to radiation who presents to the ED for intermittent chest pain that has been ongoing for approximately 1 week.  Patient describes the pain as a pressure that comes and goes without alleviating or aggravating factors.  She is concerned over the last couple of days it seems to be radiating up into her left neck causing numbness and tingling down her left arm.  She saw her PCP last week and mention symptoms to him, and he attributed this to a possible GERD.  She was started on a PPI but continues to have pain.  Today she went to her endocrinologist for her checkup and she was referred to the ED for evaluation of her chest pain. Pt does admit to significant stress at work and in life. She denies abd pain, n/v/d, fever, congestion and other systemic symptoms. ? ?Of note, at patient's PCP appt she was found to have new hypercholesterolemia with LDL of 180 and was started on rosuvastatin.  ? ? ?Chest Pain ? ?  ? ?Home Medications ?Prior to Admission medications   ?Medication Sig Start Date End Date Taking? Authorizing Provider  ?Multiple Vitamin (MULTIVITAMIN) tablet Take 1 tablet by mouth daily.    [provider]  ?rosuvastatin (CRESTOR) 5 MG tablet Take 5 mg by mouth daily. 04/21/21   [provider]  ?SYNTHROID 125 MCG tablet Take 1 tablet (125 mcg total) by mouth daily before breakfast. 04/26/21   Romero Belling, MD  ?   ? ?Allergies    ?Fish allergy, Shellfish allergy, Aspirin, and Cephalexin   ? ?Review of Systems   ?Review of Systems  ?Cardiovascular:  Positive for chest pain.  ? ?Physical Exam ?Updated Vital Signs ?BP 108/72   Pulse 75   Temp 97.9 ?F (36.6 ?C) (Oral)   Resp 19   Ht 5\' 2"  (1.575 m)   Wt 70.9 kg    LMP 11/09/2013   SpO2 99%   BMI 28.57 kg/m?  ?Physical Exam ?Vitals and nursing note reviewed.  ?Constitutional:   ?   General: She is not in acute distress. ?   Appearance: She is not ill-appearing.  ?HENT:  ?   Head: Atraumatic.  ?Eyes:  ?   Conjunctiva/sclera: Conjunctivae normal.  ?Cardiovascular:  ?   Rate and Rhythm: Normal rate and regular rhythm.  ?   Pulses: Normal pulses.     ?     Radial pulses are 2+ on the right side and 2+ on the left side.  ?     Dorsalis pedis pulses are 2+ on the right side and 2+ on the left side.  ?   Heart sounds: No murmur heard. ?Pulmonary:  ?   Effort: Pulmonary effort is normal. No respiratory distress.  ?   Breath sounds: Normal breath sounds.  ?Abdominal:  ?   General: Abdomen is flat. There is no distension.  ?   Palpations: Abdomen is soft.  ?   Tenderness: There is no abdominal tenderness.  ?Musculoskeletal:     ?   General: Normal range of motion.  ?   Cervical back: Normal range of motion.  ?Skin: ?   General: Skin is warm and dry.  ?   Capillary Refill: Capillary  refill takes less than 2 seconds.  ?Neurological:  ?   General: No focal deficit present.  ?   Mental Status: She is alert.  ?Psychiatric:     ?   Mood and Affect: Mood normal.  ? ? ?ED Results / Procedures / Treatments   ?Labs ?(all labs ordered are listed, but only abnormal results are displayed) ?Labs Reviewed  ?BASIC METABOLIC PANEL - Abnormal; Notable for the following components:  ?    Result Value  ? Potassium 5.2 (*)   ? Glucose, Bld 115 (*)   ? All other components within normal limits  ?CBC - Abnormal; Notable for the following components:  ? RBC 5.22 (*)   ? Hemoglobin 16.4 (*)   ? HCT 48.5 (*)   ? All other components within normal limits  ?TROPONIN I (HIGH SENSITIVITY)  ?TROPONIN I (HIGH SENSITIVITY)  ? ? ?EKG ?EKG Interpretation ? ?Date/Time:  Tuesday April 26 2021 09:17:54 EDT ?Ventricular Rate:  97 ?PR Interval:  116 ?QRS Duration: 92 ?QT Interval:  344 ?QTC Calculation: 436 ?R  Axis:   82 ?Text Interpretation: Normal sinus rhythm Nonspecific ST abnormality Abnormal ECG When compared with ECG of 18-Jan-2013 02:27, PREVIOUS ECG IS PRESENT no stemi Confirmed by Tanda Rockers ([id number]) on 04/26/2021 10:27:48 AM ? ?Radiology ?DG Chest 1 View ? ?Result Date: 04/26/2021 ?CLINICAL DATA:  Left-sided chest pain going up left neck and down left arm EXAM: CHEST  1 VIEW COMPARISON:  Chest radiograph 03/03/2013 FINDINGS: The cardiomediastinal silhouette is normal. There is no focal consolidation or pulmonary edema. There is no pleural effusion or pneumothorax. There is no acute osseous abnormality. IMPRESSION: No radiographic evidence of acute cardiopulmonary process. Electronically Signed   By: Lesia Hausen M.D.   On: 04/26/2021 09:52   ? ?Procedures ?Procedures  ? ? ?Medications Ordered in ED ?Medications  ?lactated ringers bolus 1,000 mL (1,000 mLs Intravenous New Bag/Given 04/26/21 1154)  ? ? ?ED Course/ Medical Decision Making/ A&P ?  ?                        ?Medical Decision Making ? ?History:  ?Per HPI ?Social determinants of health:  ?Social History  ? ?Socioeconomic History  ? Marital status: Married  ?  Spouse name: Not on file  ? Number of children: Not on file  ? Years of education: Not on file  ? Highest education level: Not on file  ?Occupational History  ? Not on file  ?Tobacco Use  ? Smoking status: Never  ? Smokeless tobacco: Never  ? Tobacco comments:  ?  10/02/13 currently 7 cigs or less a day  ?Substance and Sexual Activity  ? Alcohol use: Yes  ?  Comment: rarely  ? Drug use: No  ? Sexual activity: Not on file  ?Other Topics Concern  ? Not on file  ?Social History Narrative  ? Not on file  ? ?Social Determinants of Health  ? ?Financial Resource Strain: Not on file  ?Food Insecurity: Not on file  ?Transportation Needs: Not on file  ?Physical Activity: Not on file  ?Stress: Not on file  ?Social Connections: Not on file  ?Intimate Partner Violence: Not on file  ? ? ? ?Initial  impression: ? ?This patient presents to the ED for concern of chest pain, this involves an extensive number of treatment options, and is a complaint that carries with it a high risk of complications and morbidity.   The emergent differential diagnosis of  chest pain includes: Acute coronary syndrome, pericarditis, aortic dissection, pulmonary embolism, tension pneumothorax, and esophageal rupture. ? ?I do not believe the patient has an emergent cause of chest pain, other urgent/non-acute considerations include, but are not limited to: chronic angina, aortic stenosis, cardiomyopathy, myocarditis, mitral valve prolapse, pulmonary hypertension, hypertrophic obstructive cardiomyopathy (HOCM), aortic insufficiency, right ventricular hypertrophy, pneumonia, pleuritis, bronchitis, pneumothorax, tumor, gastroesophageal reflux disease (GERD), esophageal spasm, Mallory-Weiss syndrome, peptic ulcer disease, biliary disease, pancreatitis, functional gastrointestinal pain, cervical or thoracic disk disease or arthritis, shoulder arthritis, costochondritis, subacromial bursitis, anxiety or panic attack, herpes zoster, breast disorders, chest wall tumors, thoracic outlet syndrome, mediastinitis. ? ? ?Lab Tests and EKG: ? ?I Ordered, reviewed, and interpreted labs and EKG.  The pertinent results include:  ?Trop normal ?CBC hemoconcentrated  ?BMP mild hyperkalemia  ? ?Imaging Studies ordered: ? ?I ordered imaging studies including  ?CXR without acute findings  ?I independently visualized and interpreted imaging and I agree with the radiologist interpretation.  ? ? ?Cardiac Monitoring: ? ?The patient was maintained on a cardiac monitor.  I personally viewed and interpreted the cardiac monitored which showed an underlying rhythm of: NSR ? ? ?Medicines ordered and prescription drug management: ? ?I ordered medication including: ?LR bolus 1L    ?Reevaluation of the patient after these medicines showed that the patient improved ?I have  reviewed the patients home medicines and have made adjustments as needed ? ? ?ED Course: ?51 year old female in no acute distress, nontoxic-appearing presents to the ED for evaluation of left-sided chest pain x1 week.  Vitals are normal.  Phy

## 2021-04-26 NOTE — ED Provider Triage Note (Signed)
Emergency Medicine Provider Triage Evaluation Note ? ?Kristen Stuart , a 51 y.o. female  was evaluated in triage.  Pt complains of chest pain that has been present since Wednesday of last week. She saw her PCP who treated her symptoms for GERD but patient had no relief. Pain is described as sharp/pressure, radiating down her left arm and into her left neck. Symptoms last for a few minutes when they are present. She notices increase in painful episodes at work where it's very stressful and with minimal physical activity. Denies N/V/D, abdominal pain, fever, chills, night sweat, vaginal and urinary symptoms. ? ?Review of Systems  ?Positive: Chest pain ?Negative: Denies N/V/D, abdominal pain, fever, chills, night sweat, vaginal and urinary symptoms. ? ?Physical Exam  ?BP (!) 153/110 (BP Location: Right Arm)   Pulse 98   Temp 98.8 ?F (37.1 ?C) (Oral)   Resp 16   Ht 5\' 2"  (1.575 m)   Wt 70.9 kg   SpO2 100%   BMI 28.57 kg/m?  ?Gen:   Awake, no distress   ?Resp:  Normal effort  ?MSK:   Moves extremities without difficulty  ?Other:  No abdominal pain. Patient currently endorsing symptoms upon exam.   ? ?Medical Decision Making  ?Medically screening exam initiated at 9:22 AM.  Appropriate orders placed.  Kristen Stuart was informed that the remainder of the evaluation will be completed by another provider, this initial triage assessment does not replace that evaluation, and the importance of remaining in the ED until their evaluation is complete. ? ? ?  ?Kristen Stuart, Peter Garter ?04/26/21 [phone] ? ?

## 2021-04-26 NOTE — ED Triage Notes (Signed)
Patient arrives ambulatory by POV c/o left sided chest pain and tightness radiating into left arm with tingling and numbness onset of last wednesday. Was seen by PCP and states they did a GERD test. Saw endocrinologist today and was sent for further eval.   ?

## 2021-04-26 NOTE — Progress Notes (Signed)
? ?  Subjective:  ? ? Patient ID: Kristen Stuart, female    DOB: [birth year], 51 y.o.   MRN: [id number] ? ?HPI ?Pt returns for f/u of post-RAI hypothyroidism (dx'ed 2014; she had RAI in December of 2015, for Grave's Dz; she has been on synthroid since 2016).  She takes synthroid as rx'ed.   ?Past Medical History:  ?Diagnosis Date  ? Asthma   ? Pericardial effusion   ? Proptosis due to thyroid disorder   ? left eye  ? Thyroid disease   ? hyperthyroidism  ? Tobacco abuse   ? ? ?Past Surgical History:  ?Procedure Laterality Date  ? BREAST LUMPECTOMY Left 1999  ? L 12 o'clock, benign  ? TONSILLECTOMY    ? ? ?Social History  ? ?Socioeconomic History  ? Marital status: Married  ?  Spouse name: Not on file  ? Number of children: Not on file  ? Years of education: Not on file  ? Highest education level: Not on file  ?Occupational History  ? Not on file  ?Tobacco Use  ? Smoking status: Never  ? Smokeless tobacco: Never  ? Tobacco comments:  ?  10/02/13 currently 7 cigs or less a day  ?Substance and Sexual Activity  ? Alcohol use: Yes  ?  Comment: rarely  ? Drug use: No  ? Sexual activity: Not on file  ?Other Topics Concern  ? Not on file  ?Social History Narrative  ? Not on file  ? ?Social Determinants of Health  ? ?Financial Resource Strain: Not on file  ?Food Insecurity: Not on file  ?Transportation Needs: Not on file  ?Physical Activity: Not on file  ?Stress: Not on file  ?Social Connections: Not on file  ?Intimate Partner Violence: Not on file  ? ? ?Current Outpatient Medications on File Prior to Visit  ?Medication Sig Dispense Refill  ? Multiple Vitamin (MULTIVITAMIN) tablet Take 1 tablet by mouth daily.    ? rosuvastatin (CRESTOR) 5 MG tablet Take 5 mg by mouth daily.    ? ?No current facility-administered medications on file prior to visit.  ? ? ?Allergies  ?Allergen Reactions  ? Fish Allergy Anaphylaxis  ? Shellfish Allergy Anaphylaxis  ? Aspirin Other (See Comments)  ?  REACTION: gi upset  ? Cephalexin Other (See  Comments)  ?  "makes me crazy"  ? ? ?Family History  ?Problem Relation Age of Onset  ? Diabetes Mother   ? Hypertension Mother   ? Hyperlipidemia Mother   ? Depression Mother   ? Cancer Father   ?     Pancreatic   ? ? ?BP 124/86 (BP Location: Left Arm, Patient Position: Sitting, Cuff Size: Normal)   Pulse 99   Ht 5\' 2"  (1.575 m)   Wt 156 lb 3.2 oz (70.9 kg)   LMP 11/09/2013   SpO2 98%   BMI 28.57 kg/m?  ? ? ?Review of Systems ? ?   ?Objective:  ? Physical Exam ?VITAL SIGNS:  See vs page ?GENERAL: no distress ?NECK: There is no palpable thyroid enlargement.  No thyroid nodule is palpable.  No palpable lymphadenopathy at the anterior neck.   ? ? ?Lab Results  ?Component Value Date  ? TSH 0.74 04/26/2021  ? ?   ?Assessment & Plan:  ?Hypothyroidism: well-controlled.  Please continue the same synthroid.   ? ?

## 2021-04-26 NOTE — Patient Instructions (Addendum)
Blood tests are requested for you today.  We'll let you know about the results.  It is best to never miss the medication.  However, if you do miss it, next best is to double up the next time.  Please come back for a follow-up appointment in 1 year.   

## 2021-04-26 NOTE — Discharge Instructions (Addendum)
Your work-up today was very reassuring.  Your heart enzymes were normal toxicity and not having a heart attack.  Your chest x-ray was normal.  Your labs are also otherwise normal although it does look like you are a bit dehydrated which is why we gave you some fluids.  I did put in a referral to a cardiologist you should be calling you to set up an appointment for evaluation.  Return if symptoms worsen. ?

## 2021-05-06 ENCOUNTER — Ambulatory Visit: Payer: BC Managed Care – PPO | Admitting: Cardiovascular Disease

## 2021-05-16 ENCOUNTER — Encounter: Payer: Self-pay | Admitting: *Deleted

## 2021-05-27 DIAGNOSIS — Z1231 Encounter for screening mammogram for malignant neoplasm of breast: Secondary | ICD-10-CM | POA: Diagnosis not present

## 2021-06-14 DIAGNOSIS — Z832 Family history of diseases of the blood and blood-forming organs and certain disorders involving the immune mechanism: Secondary | ICD-10-CM | POA: Diagnosis not present

## 2021-06-14 DIAGNOSIS — Z833 Family history of diabetes mellitus: Secondary | ICD-10-CM | POA: Diagnosis not present

## 2021-06-14 DIAGNOSIS — E663 Overweight: Secondary | ICD-10-CM | POA: Diagnosis not present

## 2021-06-14 DIAGNOSIS — R5383 Other fatigue: Secondary | ICD-10-CM | POA: Diagnosis not present

## 2021-06-15 ENCOUNTER — Other Ambulatory Visit: Payer: Self-pay | Admitting: Family Medicine

## 2021-06-15 DIAGNOSIS — E782 Mixed hyperlipidemia: Secondary | ICD-10-CM | POA: Diagnosis not present

## 2021-06-15 DIAGNOSIS — F17200 Nicotine dependence, unspecified, uncomplicated: Secondary | ICD-10-CM

## 2021-06-15 DIAGNOSIS — E039 Hypothyroidism, unspecified: Secondary | ICD-10-CM | POA: Diagnosis not present

## 2021-06-15 DIAGNOSIS — K219 Gastro-esophageal reflux disease without esophagitis: Secondary | ICD-10-CM | POA: Diagnosis not present

## 2021-06-15 DIAGNOSIS — R5383 Other fatigue: Secondary | ICD-10-CM | POA: Diagnosis not present

## 2021-06-30 DIAGNOSIS — E782 Mixed hyperlipidemia: Secondary | ICD-10-CM | POA: Diagnosis not present

## 2021-06-30 DIAGNOSIS — R5383 Other fatigue: Secondary | ICD-10-CM | POA: Diagnosis not present

## 2021-06-30 DIAGNOSIS — R768 Other specified abnormal immunological findings in serum: Secondary | ICD-10-CM | POA: Diagnosis not present

## 2021-06-30 DIAGNOSIS — E559 Vitamin D deficiency, unspecified: Secondary | ICD-10-CM | POA: Diagnosis not present

## 2021-07-12 DIAGNOSIS — Z23 Encounter for immunization: Secondary | ICD-10-CM | POA: Diagnosis not present

## 2021-07-20 ENCOUNTER — Other Ambulatory Visit: Payer: Self-pay | Admitting: Family Medicine

## 2021-07-20 ENCOUNTER — Ambulatory Visit
Admission: RE | Admit: 2021-07-20 | Discharge: 2021-07-20 | Disposition: A | Discharge status: Home / Self Care | Payer: BC Managed Care – PPO | Source: Ambulatory Visit | Attending: Family Medicine | Admitting: Family Medicine

## 2021-07-20 DIAGNOSIS — Z87891 Personal history of nicotine dependence: Secondary | ICD-10-CM | POA: Diagnosis not present

## 2021-07-20 DIAGNOSIS — F17200 Nicotine dependence, unspecified, uncomplicated: Secondary | ICD-10-CM

## 2021-09-29 DIAGNOSIS — E559 Vitamin D deficiency, unspecified: Secondary | ICD-10-CM | POA: Diagnosis not present

## 2021-10-07 DIAGNOSIS — T7803XA Anaphylactic reaction due to other fish, initial encounter: Secondary | ICD-10-CM | POA: Diagnosis not present

## 2021-10-07 DIAGNOSIS — E559 Vitamin D deficiency, unspecified: Secondary | ICD-10-CM | POA: Diagnosis not present

## 2021-10-14 ENCOUNTER — Ambulatory Visit: Payer: BC Managed Care – PPO | Admitting: Endocrinology

## 2021-11-17 ENCOUNTER — Other Ambulatory Visit: Payer: Self-pay

## 2021-11-17 MED ORDER — SYNTHROID 125 MCG PO TABS: 125.0000 ug | ORAL_TABLET | Freq: Every day | ORAL | 0 refills | Status: DC

## 2021-12-14 ENCOUNTER — Encounter: Payer: Self-pay | Admitting: Internal Medicine

## 2021-12-14 ENCOUNTER — Ambulatory Visit (INDEPENDENT_AMBULATORY_CARE_PROVIDER_SITE_OTHER): Payer: BC Managed Care – PPO | Admitting: Internal Medicine

## 2021-12-14 VITALS — BP 120/82 | HR 83 | Ht 62.0 in | Wt 150.0 lb

## 2021-12-14 DIAGNOSIS — E890 Postprocedural hypothyroidism: Secondary | ICD-10-CM | POA: Diagnosis not present

## 2021-12-14 DIAGNOSIS — E0500 Thyrotoxicosis with diffuse goiter without thyrotoxic crisis or storm: Secondary | ICD-10-CM

## 2021-12-14 LAB — T4, FREE: Free T4: 0.91 ng/dL (ref 0.60–1.60)

## 2021-12-14 LAB — TSH: TSH: 2.45 u[IU]/mL (ref 0.35–5.50)

## 2021-12-14 MED ORDER — SYNTHROID 125 MCG PO TABS: 125.0000 ug | ORAL_TABLET | Freq: Every day | ORAL | 3 refills | Status: DC

## 2021-12-14 NOTE — Progress Notes (Signed)
Patient ID: Kristen Stuart, female   DOB: June 11, 1970, 51 y.o.   MRN: [id number]  HPI  Kristen Stuart is a 51 y.o.-year-old female, returning for follow-up for post ablative hypothyroidism.  Pt. has been dx with Graves' disease in 2014 2/2 palpitations, tachycardia, heat intolerance.  She had RAI treatment in 12/2013 and developed hypothyroidism.  She started levothyroxine 2016. She was not able to take generic levothyroxine 2/2 fluctuating TFTs.  She takes Synthroid DAW 125 mcg daily: - in am - fasting - with black coffee - at least 30 min from b'fast - no calcium - no iron - + multivitamins at lunchtime - no PPIs - not on Biotin  I reviewed pt's thyroid tests: Lab Results  Component Value Date   TSH 0.74 04/26/2021   TSH 0.50 10/15/2020   TSH 1.89 08/19/2020   TSH 0.11 (L) 10/17/2019   TSH 0.56 09/13/2018   TSH 0.22 (L) 03/02/2017   TSH 1.29 03/02/2016   TSH 6.38 (H) 03/30/2015   TSH 6.36 (H) 03/03/2015   TSH 74.72 (H) 02/19/2014   FREET4 1.38 04/26/2021   FREET4 1.30 10/15/2020   FREET4 1.29 08/19/2020   FREET4 1.39 10/17/2019   FREET4 1.20 03/02/2017   FREET4 1.32 03/02/2016   FREET4 1.16 03/30/2015   FREET4 0.16 (L) 02/19/2014   FREET4 1.05 10/03/2013   FREET4 0.61 10/14/2012   T3FREE 3.2 04/26/2021   T3FREE 6.6 (H) 09/03/2012   T3FREE 8.7 (H) 08/19/2012   Antithyroid antibodies: No results found for: "THGAB" No components found for: "TPOAB"  Pt describes: - + intentional weight loss - no fatigue - + cold and heat intolerance - no depression - + chronic constipation - + dry skin  - worse in winter - no hair loss  She has a history of Graves' ophthalmopathy with left eye proptosis.  Pt denies feeling nodules in neck, hoarseness, dysphagia/odynophagia. She occasionally feels something stuck in her throat - recently. She has hoarseness - chronic.  She has no FH of thyroid disorders. No FH of thyroid cancer.  No h/o radiation tx to head or  neck. No recent use of iodine supplements.  Pt. also has a history of asthma, hyperlipidemia, history of pericardial effusion.  ROS: + See HPI  Past Medical History:  Diagnosis Date   Asthma    Pericardial effusion    Proptosis due to thyroid disorder    left eye   Thyroid disease    hyperthyroidism   Tobacco abuse    Past Surgical History:  Procedure Laterality Date   BREAST LUMPECTOMY Left 1999   L 12 o'clock, benign   TONSILLECTOMY     Social History   Socioeconomic History   Marital status: Married    Spouse name: Not on file   Number of children: Not on file   Years of education: Not on file   Highest education level: Not on file  Occupational History   Not on file  Tobacco Use   Smoking status: Never   Smokeless tobacco: Never   Tobacco comments:    10/02/13 currently 7 cigs or less a day  Substance and Sexual Activity   Alcohol use: Yes    Comment: rarely   Drug use: No   Sexual activity: Not on file  Other Topics Concern   Not on file  Social History Narrative   Not on file   Social Determinants of Health   Financial Resource Strain: Not on file  Food Insecurity: Not on file  Transportation Needs: Not on file  Physical Activity: Not on file  Stress: Not on file  Social Connections: Not on file  Intimate Partner Violence: Not on file   Current Outpatient Medications on File Prior to Visit  Medication Sig Dispense Refill   Multiple Vitamin (MULTIVITAMIN) tablet Take 1 tablet by mouth daily.     rosuvastatin (CRESTOR) 5 MG tablet Take 5 mg by mouth daily.     SYNTHROID 125 MCG tablet Take 1 tablet (125 mcg total) by mouth daily before breakfast. 90 tablet 0   No current facility-administered medications on file prior to visit.   Allergies  Allergen Reactions   Fish Allergy Anaphylaxis   Shellfish Allergy Anaphylaxis   Aspirin Other (See Comments)    REACTION: gi upset   Cephalexin Other (See Comments)    "makes me crazy"   Family History   Problem Relation Age of Onset   Diabetes Mother    Hypertension Mother    Hyperlipidemia Mother    Depression Mother    Cancer Father        Pancreatic     PE: BP 120/82 (BP Location: Right Arm, Patient Position: Sitting, Cuff Size: Normal)   Pulse 83   Ht 5\' 2"  (1.575 m)   Wt 150 lb (68 kg)   LMP 11/09/2013   SpO2 98%   BMI 27.44 kg/m  Wt Readings from Last 3 Encounters:  12/14/21 150 lb (68 kg)  04/26/21 156 lb 3.2 oz (70.9 kg)  04/26/21 156 lb 3.2 oz (70.9 kg)   Constitutional: normal weight, in NAD Eyes:  EOMI, slight bilateral upper eyelid drooping ENT: no neck masses, no cervical lymphadenopathy Cardiovascular: RRR, No MRG Respiratory: CTA B Musculoskeletal: no deformities Skin:no rashes Neurological: no tremor with outstretched hands  ASSESSMENT: 1.  Post ablative hypothyroidism  2.  Graves' ophthalmopathy  PLAN:  1. Patient with long-standing hypothyroidism developed after RAI treatment for Graves' disease in 12/2013, on levothyroxine therapy since 2016 - Synthroid d.a.w. - latest thyroid labs reviewed with pt. >> normal: Lab Results  Component Value Date   TSH 0.74 04/26/2021  - she continues on Synthroid 125 mcg daily - pt feels good on this dose. - we discussed about taking the thyroid hormone every day, with water, >30 minutes before breakfast, separated by >4 hours from acid reflux medications, calcium, iron, multivitamins. Pt. is taking it correctly. - will check thyroid tests today: TSH and fT4 - If labs are abnormal, she will need to return for repeat TFTs in 1.5 months - OTW, I will see her back in a year  2.  Graves' ophthalmopathy -She had left eye proptosis >> proptosis (EUGOGO protocol) - she had IV steroid infusion for 12 weeks at [facility] -No double vision, blurry vision, chemosis -She continues to see ophthalmology - Dr. [facility] -she has slight B upper eyelid drooping -will have blepharoplasty  -no further investigation  needed  Component     Latest Ref Rng 12/14/2021  T4,Free(Direct)     0.60 - 1.60 ng/dL 12/16/2021   TSH     0.16 - 0.10 uIU/mL 2.45    TFTs are normal.  9.32, MD PhD [facility]

## 2021-12-14 NOTE — Patient Instructions (Signed)
Please continue Synthroid 125 mcg daily.  Take the thyroid hormone every day, with water, at least 30 minutes before breakfast, separated by at least 4 hours from: - acid reflux medications - calcium - iron - multivitamins  Please stop at the lab.  You should have an endocrinology follow-up appointment in 1 year.

## 2021-12-29 DIAGNOSIS — J069 Acute upper respiratory infection, unspecified: Secondary | ICD-10-CM | POA: Diagnosis not present

## 2021-12-29 DIAGNOSIS — J40 Bronchitis, not specified as acute or chronic: Secondary | ICD-10-CM | POA: Diagnosis not present

## 2021-12-29 DIAGNOSIS — B9689 Other specified bacterial agents as the cause of diseases classified elsewhere: Secondary | ICD-10-CM | POA: Diagnosis not present

## 2021-12-29 DIAGNOSIS — J329 Chronic sinusitis, unspecified: Secondary | ICD-10-CM | POA: Diagnosis not present

## 2022-01-06 DIAGNOSIS — Z720 Tobacco use: Secondary | ICD-10-CM | POA: Diagnosis not present

## 2022-01-06 DIAGNOSIS — E785 Hyperlipidemia, unspecified: Secondary | ICD-10-CM | POA: Diagnosis not present

## 2022-01-06 DIAGNOSIS — Z0000 Encounter for general adult medical examination without abnormal findings: Secondary | ICD-10-CM | POA: Diagnosis not present

## 2022-01-06 DIAGNOSIS — Z13220 Encounter for screening for lipoid disorders: Secondary | ICD-10-CM | POA: Diagnosis not present

## 2022-01-06 DIAGNOSIS — Z114 Encounter for screening for human immunodeficiency virus [HIV]: Secondary | ICD-10-CM | POA: Diagnosis not present

## 2022-01-06 DIAGNOSIS — I10 Essential (primary) hypertension: Secondary | ICD-10-CM | POA: Diagnosis not present

## 2022-01-11 DIAGNOSIS — Z23 Encounter for immunization: Secondary | ICD-10-CM | POA: Diagnosis not present

## 2022-01-11 DIAGNOSIS — D72829 Elevated white blood cell count, unspecified: Secondary | ICD-10-CM | POA: Diagnosis not present

## 2022-01-11 DIAGNOSIS — Z1211 Encounter for screening for malignant neoplasm of colon: Secondary | ICD-10-CM | POA: Diagnosis not present

## 2022-01-11 DIAGNOSIS — Z0000 Encounter for general adult medical examination without abnormal findings: Secondary | ICD-10-CM | POA: Diagnosis not present

## 2022-01-11 DIAGNOSIS — E875 Hyperkalemia: Secondary | ICD-10-CM | POA: Diagnosis not present

## 2022-01-18 DIAGNOSIS — E782 Mixed hyperlipidemia: Secondary | ICD-10-CM | POA: Diagnosis not present

## 2022-01-18 DIAGNOSIS — E039 Hypothyroidism, unspecified: Secondary | ICD-10-CM | POA: Diagnosis not present

## 2022-01-18 DIAGNOSIS — Z720 Tobacco use: Secondary | ICD-10-CM | POA: Diagnosis not present

## 2022-01-18 DIAGNOSIS — E669 Obesity, unspecified: Secondary | ICD-10-CM | POA: Diagnosis not present

## 2022-03-20 DIAGNOSIS — E669 Obesity, unspecified: Secondary | ICD-10-CM | POA: Diagnosis not present

## 2022-03-20 DIAGNOSIS — Z720 Tobacco use: Secondary | ICD-10-CM | POA: Diagnosis not present

## 2022-04-10 DIAGNOSIS — E032 Hypothyroidism due to medicaments and other exogenous substances: Secondary | ICD-10-CM | POA: Diagnosis not present

## 2022-04-10 DIAGNOSIS — Z0000 Encounter for general adult medical examination without abnormal findings: Secondary | ICD-10-CM | POA: Diagnosis not present

## 2022-04-10 DIAGNOSIS — F17210 Nicotine dependence, cigarettes, uncomplicated: Secondary | ICD-10-CM | POA: Diagnosis not present

## 2022-04-10 DIAGNOSIS — E7800 Pure hypercholesterolemia, unspecified: Secondary | ICD-10-CM | POA: Diagnosis not present

## 2022-04-10 DIAGNOSIS — L720 Epidermal cyst: Secondary | ICD-10-CM | POA: Diagnosis not present

## 2022-04-11 ENCOUNTER — Other Ambulatory Visit (HOSPITAL_COMMUNITY): Payer: Self-pay | Admitting: Internal Medicine

## 2022-04-11 DIAGNOSIS — E7800 Pure hypercholesterolemia, unspecified: Secondary | ICD-10-CM

## 2022-05-02 ENCOUNTER — Ambulatory Visit (HOSPITAL_COMMUNITY)
Admission: RE | Admit: 2022-05-02 | Discharge: 2022-05-02 | Disposition: A | Discharge status: Home / Self Care | Payer: BC Managed Care – PPO | Source: Ambulatory Visit | Attending: Internal Medicine | Admitting: Internal Medicine

## 2022-05-02 ENCOUNTER — Encounter (HOSPITAL_COMMUNITY): Payer: Self-pay

## 2022-05-02 DIAGNOSIS — E7800 Pure hypercholesterolemia, unspecified: Secondary | ICD-10-CM

## 2022-05-08 DIAGNOSIS — E7800 Pure hypercholesterolemia, unspecified: Secondary | ICD-10-CM | POA: Diagnosis not present

## 2022-05-08 DIAGNOSIS — Z91013 Allergy to seafood: Secondary | ICD-10-CM | POA: Diagnosis not present

## 2022-05-08 DIAGNOSIS — E032 Hypothyroidism due to medicaments and other exogenous substances: Secondary | ICD-10-CM | POA: Diagnosis not present

## 2022-05-11 DIAGNOSIS — E7800 Pure hypercholesterolemia, unspecified: Secondary | ICD-10-CM | POA: Diagnosis not present

## 2022-05-11 DIAGNOSIS — R7309 Other abnormal glucose: Secondary | ICD-10-CM | POA: Diagnosis not present

## 2022-05-18 DIAGNOSIS — E7800 Pure hypercholesterolemia, unspecified: Secondary | ICD-10-CM | POA: Diagnosis not present

## 2022-05-18 DIAGNOSIS — R7309 Other abnormal glucose: Secondary | ICD-10-CM | POA: Diagnosis not present

## 2022-06-09 DIAGNOSIS — Z1231 Encounter for screening mammogram for malignant neoplasm of breast: Secondary | ICD-10-CM | POA: Diagnosis not present

## 2022-06-22 DIAGNOSIS — E7800 Pure hypercholesterolemia, unspecified: Secondary | ICD-10-CM | POA: Diagnosis not present

## 2022-06-22 DIAGNOSIS — R7309 Other abnormal glucose: Secondary | ICD-10-CM | POA: Diagnosis not present

## 2022-08-30 DIAGNOSIS — R7309 Other abnormal glucose: Secondary | ICD-10-CM | POA: Diagnosis not present

## 2022-08-30 DIAGNOSIS — E7800 Pure hypercholesterolemia, unspecified: Secondary | ICD-10-CM | POA: Diagnosis not present

## 2022-09-13 DIAGNOSIS — E7800 Pure hypercholesterolemia, unspecified: Secondary | ICD-10-CM | POA: Diagnosis not present

## 2022-09-14 DIAGNOSIS — Z23 Encounter for immunization: Secondary | ICD-10-CM | POA: Diagnosis not present

## 2022-09-14 DIAGNOSIS — E7800 Pure hypercholesterolemia, unspecified: Secondary | ICD-10-CM | POA: Diagnosis not present

## 2022-10-26 DIAGNOSIS — Z0000 Encounter for general adult medical examination without abnormal findings: Secondary | ICD-10-CM | POA: Diagnosis not present

## 2022-10-26 DIAGNOSIS — R7309 Other abnormal glucose: Secondary | ICD-10-CM | POA: Diagnosis not present

## 2022-11-23 DIAGNOSIS — R142 Eructation: Secondary | ICD-10-CM | POA: Diagnosis not present

## 2022-11-23 DIAGNOSIS — R0989 Other specified symptoms and signs involving the circulatory and respiratory systems: Secondary | ICD-10-CM | POA: Diagnosis not present

## 2022-11-23 DIAGNOSIS — R12 Heartburn: Secondary | ICD-10-CM | POA: Diagnosis not present

## 2022-12-15 ENCOUNTER — Ambulatory Visit: Payer: BC Managed Care – PPO | Admitting: Internal Medicine

## 2022-12-15 VITALS — BP 120/70 | HR 80 | Ht 62.0 in | Wt 154.0 lb

## 2022-12-15 DIAGNOSIS — E0500 Thyrotoxicosis with diffuse goiter without thyrotoxic crisis or storm: Secondary | ICD-10-CM

## 2022-12-15 DIAGNOSIS — E890 Postprocedural hypothyroidism: Secondary | ICD-10-CM

## 2022-12-15 MED ORDER — ROSUVASTATIN CALCIUM 10 MG PO TABS: 10.0000 mg | ORAL_TABLET | Freq: Every day | ORAL | Status: AC

## 2022-12-15 NOTE — Progress Notes (Unsigned)
Patient ID: Kristen Stuart, female   DOB: 12-14-1970, 52 y.o.   MRN: [id number]  HPI  [name] is a 52 y.o.-year-old female, returning for follow-up for post ablative hypothyroidism.  Last visit was a year ago.  Interim history: She is planning to have blepharoplasty, but insurance did not cover this yet. She started Crestor 10 mg daily.  She also started Famotidine for GERD - had CP. She had an occasional sensation of snapping pain in L eye - random.  Reviewed history: Pt. has been dx with Graves' disease in 2014 2/2 palpitations, tachycardia, heat intolerance.  She had RAI treatment in 12/2013 and developed hypothyroidism.  She started levothyroxine in 2016.  She was not able to take generic levothyroxine 2/2 fluctuating TFTs.  She takes Synthroid DAW 125 mcg daily: - in am (5:30-6 am) - fasting - with black coffee - at least 2h from b'fast - no calcium - no iron - + multivitamins at lunchtime - no PPIs, + Pepcid at night - not on Biotin  I reviewed pt's thyroid tests: Lab Results  Component Value Date   TSH 2.45 12/14/2021   TSH 0.74 04/26/2021   TSH 0.50 10/15/2020   TSH 1.89 08/19/2020   TSH 0.11 (L) 10/17/2019   TSH 0.56 09/13/2018   TSH 0.22 (L) 03/02/2017   TSH 1.29 03/02/2016   TSH 6.38 (H) 03/30/2015   TSH 6.36 (H) 03/03/2015   FREET4 0.91 12/14/2021   FREET4 1.38 04/26/2021   FREET4 1.30 10/15/2020   FREET4 1.29 08/19/2020   FREET4 1.39 10/17/2019   FREET4 1.20 03/02/2017   FREET4 1.32 03/02/2016   FREET4 1.16 03/30/2015   FREET4 0.16 (L) 02/19/2014   FREET4 1.05 10/03/2013   T3FREE 3.2 04/26/2021   T3FREE 6.6 (H) 09/03/2012   T3FREE 8.7 (H) 08/19/2012   Antithyroid antibodies: No results found for: "THGAB" No components found for: "TPOAB"  Pt describes: - no unintentional weight loss - no fatigue - + cold and heat intolerance - no depression - + chronic constipation - + dry skin  - worse in winter - no hair loss  She has a  history of Graves' ophthalmopathy with left eye proptosis.  Pt denies feeling nodules in neck, hoarseness, dysphagia/odynophagia.  She occasionally was feeling something stuck in her throat - resolved after starting Pepcid She has hoarseness - chronic.   She has no FH of thyroid disorders. No FH of thyroid cancer.  No h/o radiation tx to head or neck. No recent use of iodine supplements.  Pt. also has a history of asthma, hyperlipidemia, history of pericardial effusion.  ROS: + See HPI  Past Medical History:  Diagnosis Date   Asthma    Pericardial effusion    Proptosis due to thyroid disorder    left eye   Thyroid disease    hyperthyroidism   Tobacco abuse    Past Surgical History:  Procedure Laterality Date   BREAST LUMPECTOMY Left 1999   L 12 o'clock, benign   TONSILLECTOMY     Social History   Socioeconomic History   Marital status: Married    Spouse name: Not on file   Number of children: Not on file   Years of education: Not on file   Highest education level: Not on file  Occupational History   Not on file  Tobacco Use   Smoking status: Never   Smokeless tobacco: Never   Tobacco comments:    10/02/13 currently 7 cigs or less a day  Substance and Sexual Activity   Alcohol use: Yes    Comment: rarely   Drug use: No   Sexual activity: Not on file  Other Topics Concern   Not on file  Social History Narrative   Not on file   Social Drivers of Health   Financial Resource Strain: Not on file  Food Insecurity: Not on file  Transportation Needs: Not on file  Physical Activity: Not on file  Stress: Not on file  Social Connections: Not on file  Intimate Partner Violence: Not on file   Current Outpatient Medications on File Prior to Visit  Medication Sig Dispense Refill   Multiple Vitamin (MULTIVITAMIN) tablet Take 1 tablet by mouth daily.     rosuvastatin (CRESTOR) 5 MG tablet Take 5 mg by mouth daily.     SYNTHROID 125 MCG tablet Take 1 tablet (125 mcg  total) by mouth daily before breakfast. 90 tablet 3   No current facility-administered medications on file prior to visit.   Allergies  Allergen Reactions   Fish Allergy Anaphylaxis   Shellfish Allergy Anaphylaxis   Aspirin Other (See Comments)    REACTION: gi upset   Cephalexin Other (See Comments)    "makes me crazy"   Family History  Problem Relation Age of Onset   Diabetes Mother    Hypertension Mother    Hyperlipidemia Mother    Depression Mother    Cancer Father        Pancreatic     PE: LMP 11/09/2013  Wt Readings from Last 3 Encounters:  12/14/21 150 lb (68 kg)  04/26/21 156 lb 3.2 oz (70.9 kg)  04/26/21 156 lb 3.2 oz (70.9 kg)   Constitutional: normal weight, in NAD Eyes:  EOMI ENT: no neck masses, no cervical lymphadenopathy Cardiovascular: RRR, No MRG Respiratory: CTA B Musculoskeletal: no deformities Skin:no rashes Neurological: no tremor with outstretched hands  ASSESSMENT: 1.  Post ablative hypothyroidism  2.  Graves' ophthalmopathy  PLAN:  1. Patient with long-standing hypothyroidism developed after RAI treatment for Graves' disease in 12/2013, on levothyroxine therapy since 2016 (Synthroid d.a.w.) - latest thyroid labs reviewed with pt. >> normal: Lab Results  Component Value Date   TSH 2.45 12/14/2021  - she continues on Synthroid 125 mcg daily - pt feels good on this dose. - we discussed about taking the thyroid hormone every day, with water, >30 minutes before breakfast, separated by >4 hours from acid reflux medications, calcium, iron, multivitamins. Pt. is taking it correctly. - will check thyroid tests today: TSH and fT4 - If labs are abnormal, she will need to return for repeat TFTs in 1.5 months - OTW I will see her back in a year  2.  Graves' ophthalmopathy -She had left eye proptosis and had IV steroids for 12 weeks at [facility] (EUGOGO protocol) -she is preparing blepharoplasty for slight bilateral upper eyelid drooping  -At  today's visit, she does not have signs of active Graves' ophthalmopathy: Blurry vision, double vision, eye pain, chemosis -She continues to see ophthalmology-Dr. Randon Goldsmith -No further investigation needed  Needs refills - [facility] [place].  Carlus Pavlov, MD PhD [facility]

## 2022-12-15 NOTE — Patient Instructions (Signed)
Please continue Synthroid 125 mcg daily.  Take the thyroid hormone every day, with water, at least 30 minutes before breakfast, separated by at least 4 hours from: - acid reflux medications - calcium - iron - multivitamins  Please stop at the lab.  You should have an endocrinology follow-up appointment in 1 year.

## 2022-12-16 LAB — T4, FREE: Free T4: 1.5 ng/dL (ref 0.8–1.8)

## 2022-12-16 LAB — TSH: TSH: 0.69 m[IU]/L

## 2022-12-18 ENCOUNTER — Encounter: Payer: Self-pay | Admitting: Internal Medicine

## 2022-12-18 MED ORDER — SYNTHROID 125 MCG PO TABS: 125.0000 ug | ORAL_TABLET | Freq: Every day | ORAL | 3 refills | Status: DC

## 2023-04-09 DIAGNOSIS — Z0000 Encounter for general adult medical examination without abnormal findings: Secondary | ICD-10-CM | POA: Diagnosis not present

## 2023-04-12 DIAGNOSIS — Z0000 Encounter for general adult medical examination without abnormal findings: Secondary | ICD-10-CM | POA: Diagnosis not present

## 2023-04-25 DIAGNOSIS — R030 Elevated blood-pressure reading, without diagnosis of hypertension: Secondary | ICD-10-CM | POA: Diagnosis not present

## 2023-04-25 DIAGNOSIS — R141 Gas pain: Secondary | ICD-10-CM | POA: Diagnosis not present

## 2023-04-25 DIAGNOSIS — R000 Tachycardia, unspecified: Secondary | ICD-10-CM | POA: Diagnosis not present

## 2023-04-25 DIAGNOSIS — K30 Functional dyspepsia: Secondary | ICD-10-CM | POA: Diagnosis not present

## 2023-04-26 DIAGNOSIS — K5904 Chronic idiopathic constipation: Secondary | ICD-10-CM | POA: Diagnosis not present

## 2023-04-26 DIAGNOSIS — Z1211 Encounter for screening for malignant neoplasm of colon: Secondary | ICD-10-CM | POA: Diagnosis not present

## 2023-04-26 DIAGNOSIS — K219 Gastro-esophageal reflux disease without esophagitis: Secondary | ICD-10-CM | POA: Diagnosis not present

## 2023-05-11 DIAGNOSIS — J40 Bronchitis, not specified as acute or chronic: Secondary | ICD-10-CM | POA: Diagnosis not present

## 2023-05-11 DIAGNOSIS — R0989 Other specified symptoms and signs involving the circulatory and respiratory systems: Secondary | ICD-10-CM | POA: Diagnosis not present

## 2023-06-05 DIAGNOSIS — R030 Elevated blood-pressure reading, without diagnosis of hypertension: Secondary | ICD-10-CM | POA: Diagnosis not present

## 2023-06-05 DIAGNOSIS — F411 Generalized anxiety disorder: Secondary | ICD-10-CM | POA: Diagnosis not present

## 2023-06-15 DIAGNOSIS — Z1231 Encounter for screening mammogram for malignant neoplasm of breast: Secondary | ICD-10-CM | POA: Diagnosis not present

## 2023-06-20 DIAGNOSIS — S0086XA Insect bite (nonvenomous) of other part of head, initial encounter: Secondary | ICD-10-CM | POA: Diagnosis not present

## 2023-06-20 DIAGNOSIS — L309 Dermatitis, unspecified: Secondary | ICD-10-CM | POA: Diagnosis not present

## 2023-06-22 DIAGNOSIS — K635 Polyp of colon: Secondary | ICD-10-CM | POA: Diagnosis not present

## 2023-06-22 DIAGNOSIS — D122 Benign neoplasm of ascending colon: Secondary | ICD-10-CM | POA: Diagnosis not present

## 2023-06-22 DIAGNOSIS — K5730 Diverticulosis of large intestine without perforation or abscess without bleeding: Secondary | ICD-10-CM | POA: Diagnosis not present

## 2023-06-22 DIAGNOSIS — Z1211 Encounter for screening for malignant neoplasm of colon: Secondary | ICD-10-CM | POA: Diagnosis not present

## 2023-07-04 DIAGNOSIS — S0086XD Insect bite (nonvenomous) of other part of head, subsequent encounter: Secondary | ICD-10-CM | POA: Diagnosis not present

## 2023-08-07 DIAGNOSIS — R030 Elevated blood-pressure reading, without diagnosis of hypertension: Secondary | ICD-10-CM | POA: Diagnosis not present

## 2023-08-07 DIAGNOSIS — F411 Generalized anxiety disorder: Secondary | ICD-10-CM | POA: Diagnosis not present

## 2023-10-12 DIAGNOSIS — Z23 Encounter for immunization: Secondary | ICD-10-CM | POA: Diagnosis not present

## 2023-10-12 DIAGNOSIS — F411 Generalized anxiety disorder: Secondary | ICD-10-CM | POA: Diagnosis not present

## 2023-10-12 DIAGNOSIS — I2510 Atherosclerotic heart disease of native coronary artery without angina pectoris: Secondary | ICD-10-CM | POA: Diagnosis not present

## 2023-10-12 DIAGNOSIS — R7303 Prediabetes: Secondary | ICD-10-CM | POA: Diagnosis not present

## 2023-10-12 DIAGNOSIS — E032 Hypothyroidism due to medicaments and other exogenous substances: Secondary | ICD-10-CM | POA: Diagnosis not present

## 2023-11-14 DIAGNOSIS — F17210 Nicotine dependence, cigarettes, uncomplicated: Secondary | ICD-10-CM | POA: Diagnosis not present

## 2023-12-14 DIAGNOSIS — F411 Generalized anxiety disorder: Secondary | ICD-10-CM | POA: Diagnosis not present

## 2023-12-14 DIAGNOSIS — F17210 Nicotine dependence, cigarettes, uncomplicated: Secondary | ICD-10-CM | POA: Diagnosis not present

## 2023-12-14 DIAGNOSIS — E032 Hypothyroidism due to medicaments and other exogenous substances: Secondary | ICD-10-CM | POA: Diagnosis not present

## 2023-12-21 ENCOUNTER — Encounter: Payer: Self-pay | Admitting: Internal Medicine

## 2023-12-21 ENCOUNTER — Ambulatory Visit: Payer: BC Managed Care – PPO | Admitting: Internal Medicine

## 2023-12-21 ENCOUNTER — Other Ambulatory Visit

## 2023-12-21 VITALS — BP 120/70 | HR 83 | Ht 62.0 in | Wt 170.2 lb

## 2023-12-21 DIAGNOSIS — E890 Postprocedural hypothyroidism: Secondary | ICD-10-CM

## 2023-12-21 DIAGNOSIS — H05839 Thyroid orbitopathy, unspecified orbit: Secondary | ICD-10-CM

## 2023-12-21 DIAGNOSIS — E0500 Thyrotoxicosis with diffuse goiter without thyrotoxic crisis or storm: Secondary | ICD-10-CM | POA: Diagnosis not present

## 2023-12-21 NOTE — Patient Instructions (Addendum)
 Please continue Synthroid  125 mcg daily.  Take the thyroid  hormone every day, with water, at least 30 minutes before breakfast, separated by at least 4 hours from: - acid reflux medications - calcium  - iron - multivitamins  Please stop at the lab.  Please return for a follow-up appointment in 1 year.

## 2023-12-21 NOTE — Progress Notes (Signed)
 Patient ID: Kristen Stuart, female   DOB: December 14, 1970, 53 y.o.   MRN: [id number]  HPI  Kristen Stuart is a 53 y.o.-year-old female, returning for follow-up for post ablative hypothyroidism.  Last visit was 1 year ago.  Interim history: She is planning to have blepharoplasty, but insurance did not cover this yet as she did not qualify previously, but after she stopped getting Botox injections to help with this, she is not qualifying for the surgery and will have this done 02/2024.  She has dry eyes, but no double vision, blurry vision, pain when she moves her eyes.    Reviewed history: Pt. has been dx with Graves' disease in 2014 2/2 palpitations, tachycardia, heat intolerance.  She had RAI treatment in 12/2013 and developed hypothyroidism.  She started levothyroxine  in 2016.  She was not able to take generic levothyroxine  2/2 fluctuating TFTs.  She takes Synthroid  DAW 125 mcg daily: - in am (5:30-6 am) - fasting - with black coffee - at least 2h from b'fast - no calcium  - no iron - + multivitamins at lunchtime - no PPIs, + Pepcid at night - not on Biotin  I reviewed pt's thyroid  tests: Lab Results  Component Value Date   TSH 0.69 12/15/2022   TSH 2.45 12/14/2021   TSH 0.74 04/26/2021   TSH 0.50 10/15/2020   TSH 1.89 08/19/2020   TSH 0.11 (L) 10/17/2019   TSH 0.56 09/13/2018   TSH 0.22 (L) 03/02/2017   TSH 1.29 03/02/2016   TSH 6.38 (H) 03/30/2015   FREET4 1.5 12/15/2022   FREET4 0.91 12/14/2021   FREET4 1.38 04/26/2021   FREET4 1.30 10/15/2020   FREET4 1.29 08/19/2020   FREET4 1.39 10/17/2019   FREET4 1.20 03/02/2017   FREET4 1.32 03/02/2016   FREET4 1.16 03/30/2015   FREET4 0.16 (L) 02/19/2014   T3FREE 3.2 04/26/2021   T3FREE 6.6 (H) 09/03/2012   T3FREE 8.7 (H) 08/19/2012   Antithyroid antibodies: No results found for: TSI No results found for: THGAB No components found for: TPOAB  Pt describes: - no unintentional weight loss - no fatigue - +  cold and heat intolerance - no depression - + chronic constipation - + dry skin  - worse in winter - + dry eyes - no hair loss  She has a history of Graves' ophthalmopathy with left eye proptosis.  Pt denies feeling nodules in neck, hoarseness, dysphagia/odynophagia.  She occasionally was feeling something stuck in her throat - resolved after starting Pepcid She has hoarseness - chronic.   She has no FH of thyroid  disorders. No FH of thyroid  cancer.  No h/o radiation tx to head or neck. No recent use of iodine supplements.  Pt. also has a history of asthma, hyperlipidemia - on Crestor , history of pericardial effusion. Has anxiety - on Buproprion, Sertraline. These were started earlier in the year - after she had a panic attack.  ROS: + See HPI  Past Medical History:  Diagnosis Date   Asthma    Pericardial effusion    Proptosis due to thyroid  disorder    left eye   Thyroid  disease    hyperthyroidism   Tobacco abuse    Past Surgical History:  Procedure Laterality Date   BREAST LUMPECTOMY Left 1999   L 12 o'clock, benign   TONSILLECTOMY     Social History   Socioeconomic History   Marital status: Married    Spouse name: Not on file   Number of children: Not on file  Years of education: Not on file   Highest education level: Not on file  Occupational History   Not on file  Tobacco Use   Smoking status: Never   Smokeless tobacco: Never   Tobacco comments:    10/02/13 currently 7 cigs or less a day  Substance and Sexual Activity   Alcohol use: Yes    Comment: rarely   Drug use: No   Sexual activity: Not on file  Other Topics Concern   Not on file  Social History Narrative   Not on file   Social Drivers of Health   Tobacco Use: Low Risk (12/18/2022)   Patient History    Smoking Tobacco Use: Never    Smokeless Tobacco Use: Never    Passive Exposure: Not on file  Financial Resource Strain: Not on file  Food Insecurity: Not on file  Transportation Needs:  Not on file  Physical Activity: Not on file  Stress: Not on file  Social Connections: Not on file  Intimate Partner Violence: Not on file  Depression (EYV7-0): Not on file  Alcohol Screen: Not on file  Housing: Not on file  Utilities: Not on file  Health Literacy: Not on file   Current Outpatient Medications on File Prior to Visit  Medication Sig Dispense Refill   Multiple Vitamin (MULTIVITAMIN) tablet Take 1 tablet by mouth daily.     rosuvastatin  (CRESTOR ) 10 MG tablet Take 1 tablet (10 mg total) by mouth daily.     SYNTHROID  125 MCG tablet Take 1 tablet (125 mcg total) by mouth daily before breakfast. 90 tablet 3   No current facility-administered medications on file prior to visit.   Allergies  Allergen Reactions   Fish Allergy Anaphylaxis   Shellfish Allergy Anaphylaxis   Aspirin Other (See Comments)    REACTION: gi upset   Cephalexin Other (See Comments)    makes me crazy   Family History  Problem Relation Age of Onset   Diabetes Mother    Hypertension Mother    Hyperlipidemia Mother    Depression Mother    Cancer Father        Pancreatic    PE: BP 120/70   Pulse 83   Ht 5' 2 (1.575 m)   Wt 170 lb 3.2 oz (77.2 kg)   LMP 11/09/2013   SpO2 95%   BMI 31.13 kg/m  Wt Readings from Last 3 Encounters:  12/21/23 170 lb 3.2 oz (77.2 kg)  12/15/22 154 lb (69.9 kg)  12/14/21 150 lb (68 kg)   Constitutional: normal weight, in NAD Eyes:  EOMI ENT: no neck masses, no cervical lymphadenopathy Cardiovascular: RRR, No MRG Respiratory: CTA B Musculoskeletal: no deformities Skin:no rashes Neurological: no tremor with outstretched hands  ASSESSMENT: 1.  Post ablative hypothyroidism  2.  Graves' ophthalmopathy  PLAN:  1. Patient with longstanding hypothyroidism, developed after RAI treatment in 12/2023, on levothyroxine  therapy since 2016.  She is on brand-name Synthroid .   - latest thyroid  labs reviewed with pt. >> normal: Lab Results  Component Value Date    TSH 0.69 12/15/2022  - she continues on Synthroid  125 mcg daily - pt feels good on this dose.  She had a 16 pound weight gain since last visit.  No palpitations, tremors, or other complaints. - we discussed about taking the thyroid  hormone every day, with water, >30 minutes before breakfast, separated by >4 hours from acid reflux medications, calcium , iron, multivitamins. Pt. is taking it correctly. - will check thyroid  tests today: TSH  and fT4 - If labs are abnormal, she will need to return for repeat TFTs in 1.5 months - OTW, I will see her back in 1 year  2.  Graves' ophthalmopathy -She had left eye proptosis and had IV steroids for 12 weeks at [facility] (EUGOGO protocol) - At last visit she was preparing for blepharoplasty for slight bilateral upper eyelid drooping.  She did not qualify for this yet due to the fact that her blepharoptosis was not considered severe enough.  However, she mentions that she was getting Botox injections at that time which helped, but she stopped getting them and now she does qualify for surgery.  She will have this in 2 months. - She continues to see ophthalmology-Dr. Charmayne - No signs of active Graves' ophthalmopathy at today's visit: No blurry vision, double vision, eye pain but continues to have dry eyes. - Will continue to follow her clinically  Orders Placed This Encounter  Procedures   TSH   T4, free   Needs refills - [facility] [place].  Lela Fendt, MD PhD [facility]

## 2023-12-22 LAB — TSH: TSH: 1.63 m[IU]/L

## 2023-12-22 LAB — T4, FREE: Free T4: 1.5 ng/dL (ref 0.8–1.8)

## 2023-12-24 ENCOUNTER — Ambulatory Visit: Payer: Self-pay | Admitting: Internal Medicine

## 2023-12-24 MED ORDER — SYNTHROID 125 MCG PO TABS: 125.0000 ug | ORAL_TABLET | Freq: Every day | ORAL | 3 refills | Status: AC

## 2023-12-24 NOTE — Addendum Note (Signed)
 Addended by: TRIXIE FILE on: 12/24/2023 01:10 PM   Modules accepted: Orders

## 2023-12-26 ENCOUNTER — Other Ambulatory Visit: Payer: Self-pay | Admitting: Internal Medicine

## 2024-12-19 ENCOUNTER — Ambulatory Visit: Admitting: Internal Medicine
